# Patient Record
Sex: Female | Born: 1980 | Race: White | Hispanic: No | Marital: Married | Smoking: Never smoker
Health system: Southern US, Community
[De-identification: ages and names within clinical notes are randomized; demographics above are authoritative.]

---

## 2015-10-07 DIAGNOSIS — L700 Acne vulgaris: Secondary | ICD-10-CM | POA: Insufficient documentation

## 2018-06-10 ENCOUNTER — Other Ambulatory Visit: Payer: Self-pay | Admitting: Internal Medicine

## 2018-06-10 DIAGNOSIS — Z1231 Encounter for screening mammogram for malignant neoplasm of breast: Secondary | ICD-10-CM

## 2018-07-05 ENCOUNTER — Ambulatory Visit
Admission: RE | Admit: 2018-07-05 | Discharge: 2018-07-05 | Disposition: A | Discharge status: Home / Self Care | Payer: BLUE CROSS/BLUE SHIELD | Source: Ambulatory Visit | Attending: Internal Medicine | Admitting: Internal Medicine

## 2018-07-05 ENCOUNTER — Other Ambulatory Visit: Payer: Self-pay

## 2018-07-05 DIAGNOSIS — Z1231 Encounter for screening mammogram for malignant neoplasm of breast: Secondary | ICD-10-CM

## 2019-01-03 DIAGNOSIS — H6993 Unspecified Eustachian tube disorder, bilateral: Secondary | ICD-10-CM | POA: Insufficient documentation

## 2019-09-04 ENCOUNTER — Other Ambulatory Visit: Payer: Self-pay | Admitting: Internal Medicine

## 2019-09-04 DIAGNOSIS — Z1231 Encounter for screening mammogram for malignant neoplasm of breast: Secondary | ICD-10-CM

## 2019-09-12 ENCOUNTER — Ambulatory Visit: Payer: BLUE CROSS/BLUE SHIELD

## 2019-09-19 ENCOUNTER — Other Ambulatory Visit: Payer: Self-pay

## 2019-09-19 ENCOUNTER — Ambulatory Visit
Admission: RE | Admit: 2019-09-19 | Discharge: 2019-09-19 | Disposition: A | Discharge status: Home / Self Care | Payer: PRIVATE HEALTH INSURANCE | Source: Ambulatory Visit | Attending: Internal Medicine | Admitting: Internal Medicine

## 2019-09-19 DIAGNOSIS — Z1231 Encounter for screening mammogram for malignant neoplasm of breast: Secondary | ICD-10-CM

## 2019-09-24 ENCOUNTER — Other Ambulatory Visit: Payer: Self-pay | Admitting: Internal Medicine

## 2019-09-24 ENCOUNTER — Other Ambulatory Visit: Payer: Self-pay | Admitting: Family Medicine

## 2019-09-24 DIAGNOSIS — R928 Other abnormal and inconclusive findings on diagnostic imaging of breast: Secondary | ICD-10-CM

## 2019-09-26 ENCOUNTER — Ambulatory Visit
Admission: RE | Admit: 2019-09-26 | Discharge: 2019-09-26 | Disposition: A | Discharge status: Home / Self Care | Payer: PRIVATE HEALTH INSURANCE | Source: Ambulatory Visit | Attending: Internal Medicine | Admitting: Internal Medicine

## 2019-09-26 ENCOUNTER — Other Ambulatory Visit: Payer: Self-pay

## 2019-09-26 DIAGNOSIS — R928 Other abnormal and inconclusive findings on diagnostic imaging of breast: Secondary | ICD-10-CM

## 2021-04-04 ENCOUNTER — Other Ambulatory Visit: Payer: Self-pay | Admitting: Family Medicine

## 2021-04-04 DIAGNOSIS — Z1231 Encounter for screening mammogram for malignant neoplasm of breast: Secondary | ICD-10-CM

## 2021-06-06 IMAGING — MG DIGITAL SCREENING BILAT W/ TOMO W/ CAD
8 series · 8 of 24 positions shown · non-contrast
Comparison: Prior films

CLINICAL DATA: Screening.

EXAM:
DIGITAL SCREENING BILATERAL MAMMOGRAM WITH TOMO AND CAD

[L MLO synth-2D]
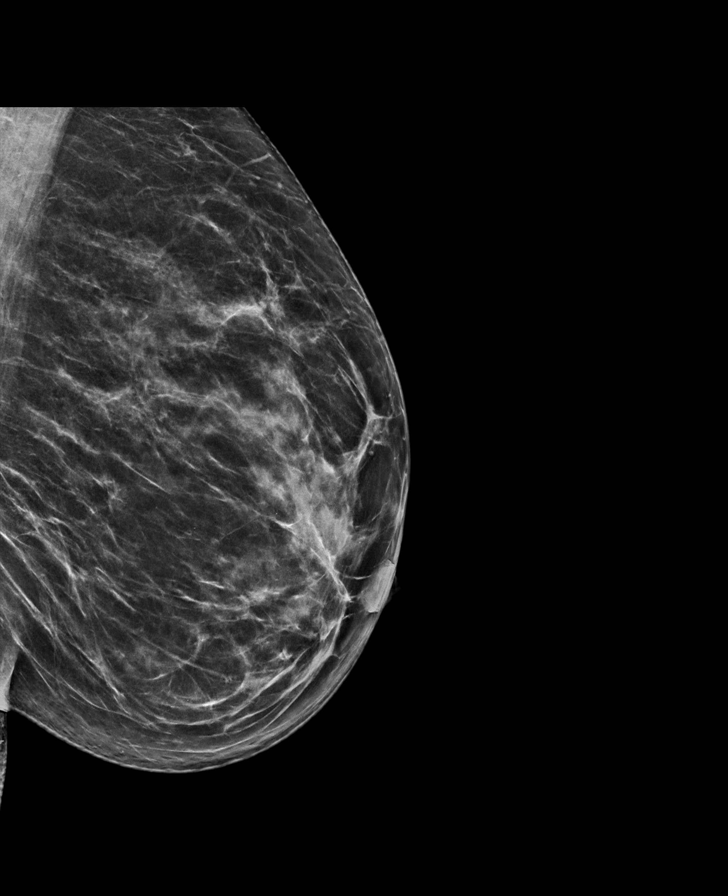

[L CC synth-2D]
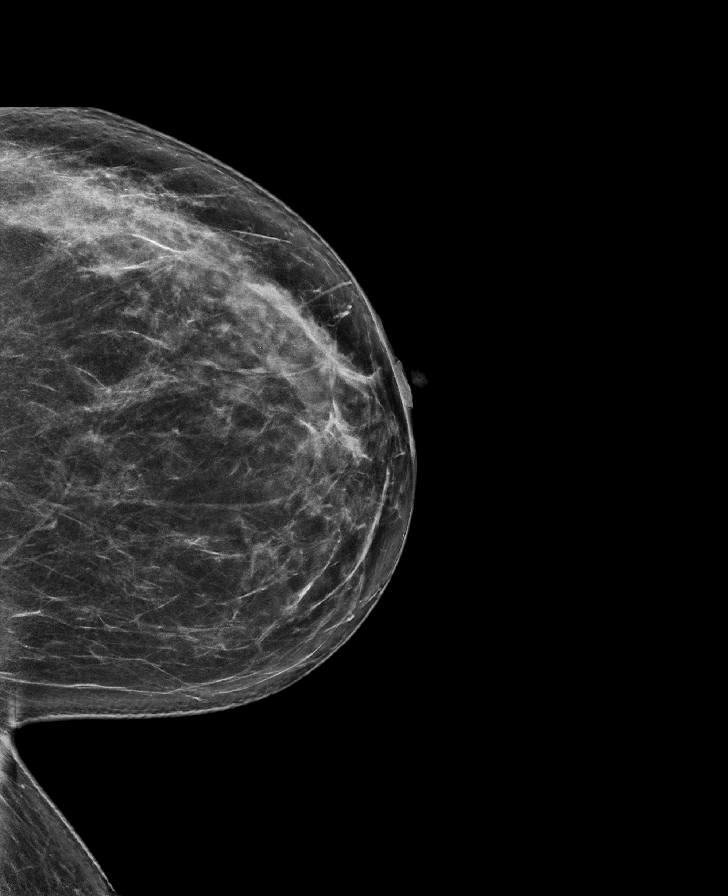

[R CC synth-2D]
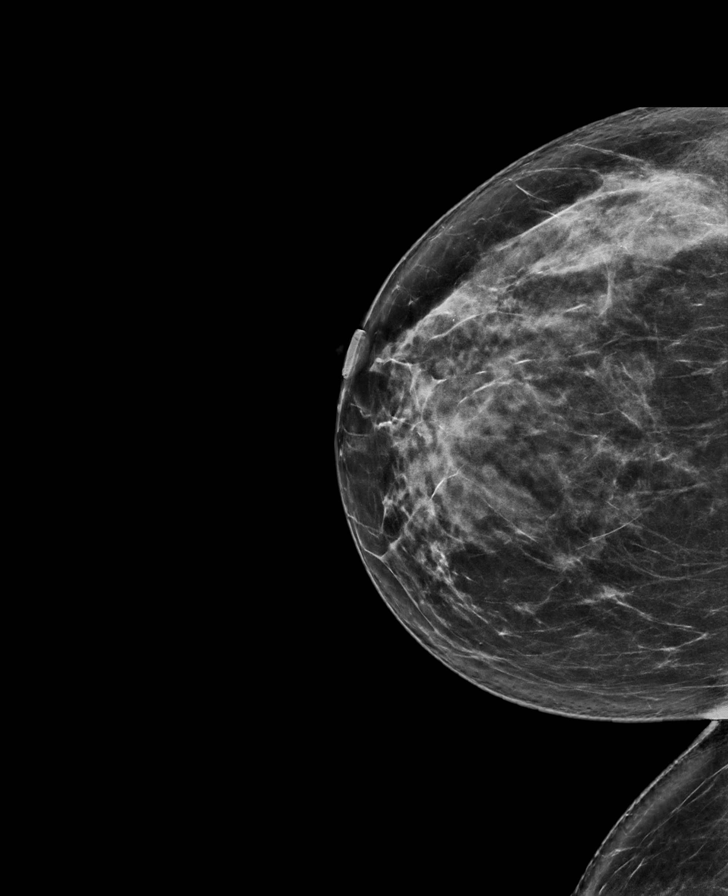

[R MLO synth-2D]
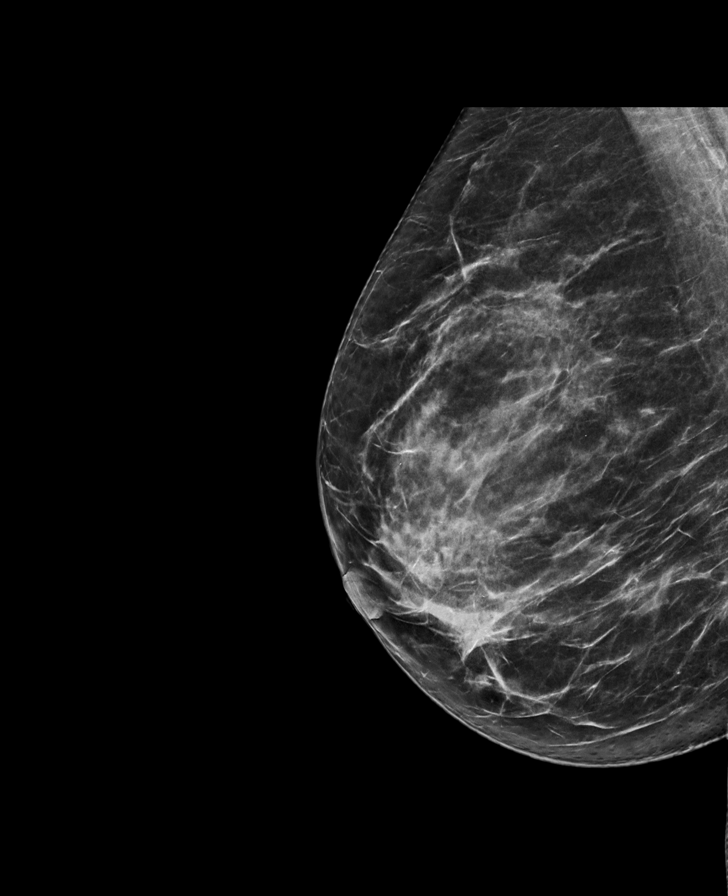

[R MLO tomo · tomo slice 37/74.0]
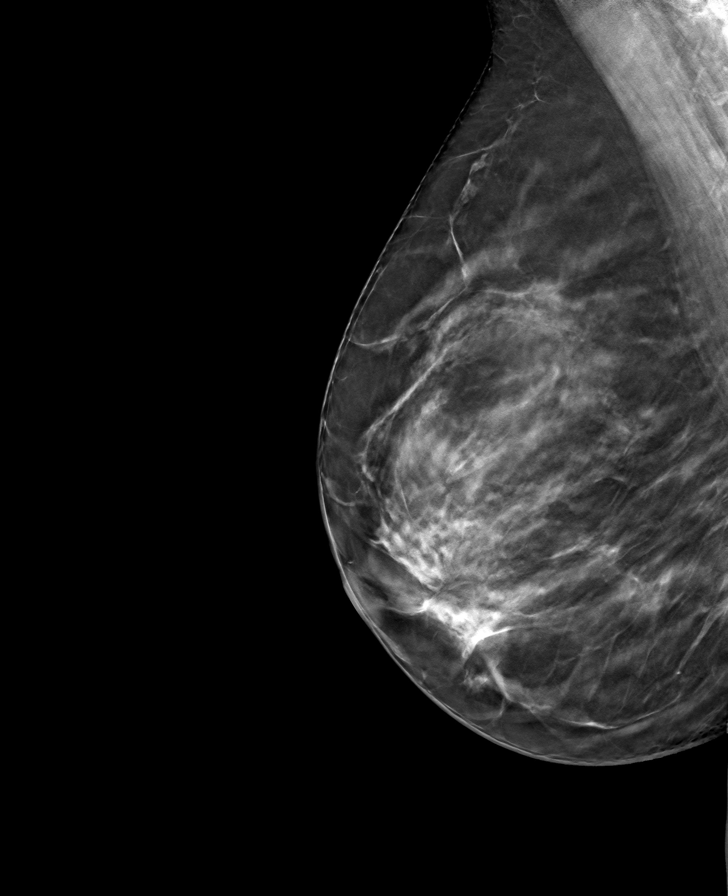

[R CC tomo · tomo slice 39/77.0]
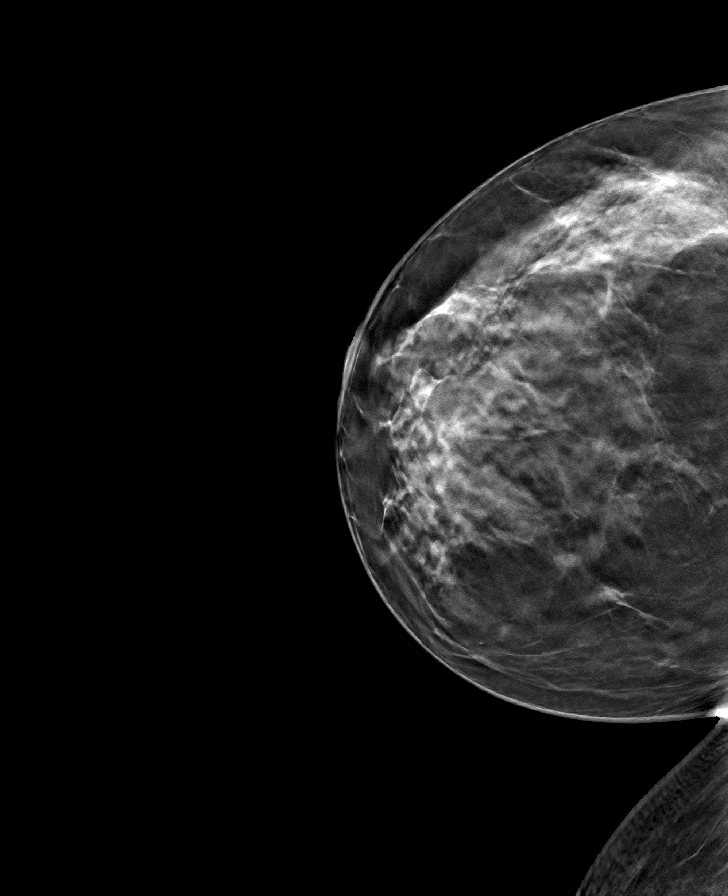

[L MLO tomo · tomo slice 37/74.0]
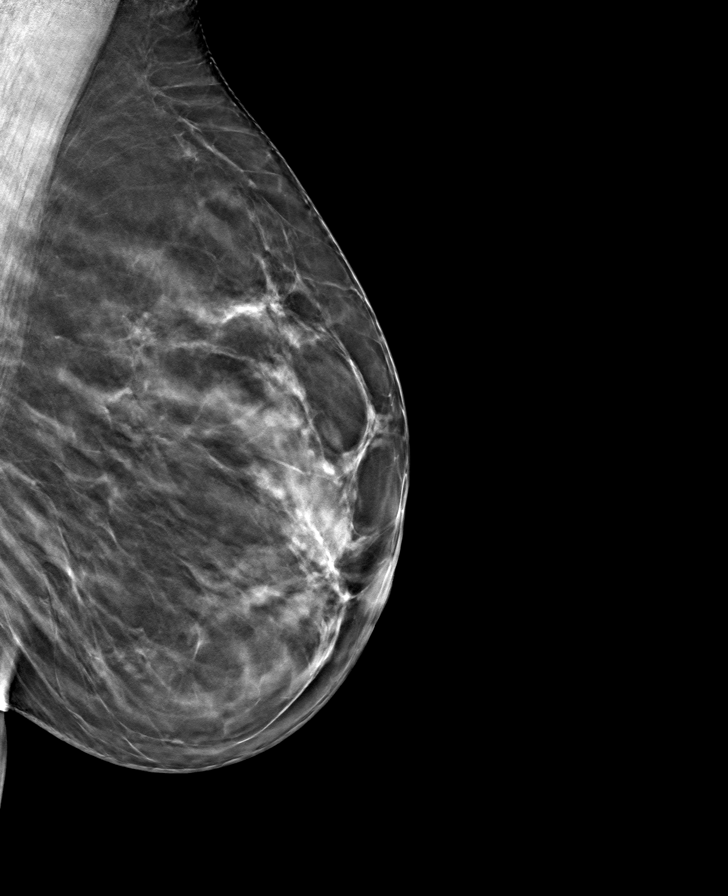

[L CC tomo · tomo slice 40/79.0]
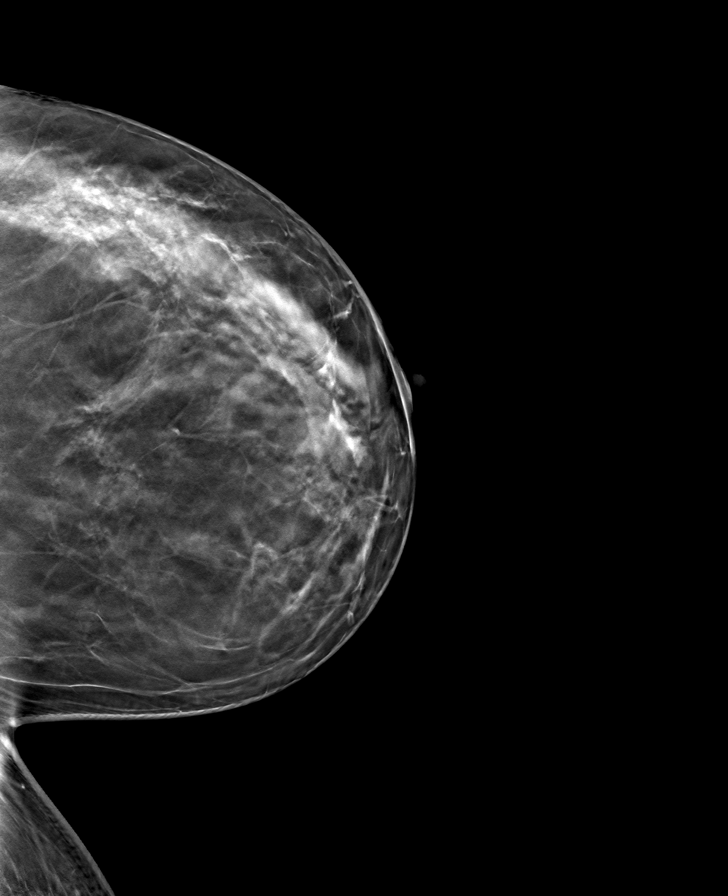

[8 of 24 positions shown; findings below may reference images not displayed]

ACR Breast Density Category c: The breast tissue is heterogeneously
dense, which may obscure small masses.
FINDINGS: In the right breast, a possible asymmetry warrants further
evaluation. In the left breast, no findings suspicious for
malignancy. Images were processed with CAD.
IMPRESSION: Further evaluation is suggested for possible asymmetry in the right
breast.

RECOMMENDATION:
Diagnostic mammogram and possibly ultrasound of the right breast.
(Code:[id number])

The patient will be contacted regarding the findings, and additional
imaging will be scheduled.

BI-RADS CATEGORY  0: Incomplete. Need additional imaging evaluation
and/or prior mammograms for comparison.

## 2022-05-11 ENCOUNTER — Other Ambulatory Visit: Payer: Self-pay | Admitting: Otolaryngology

## 2022-05-11 DIAGNOSIS — H9209 Otalgia, unspecified ear: Secondary | ICD-10-CM

## 2022-07-06 ENCOUNTER — Encounter: Payer: Self-pay | Admitting: Neurology

## 2022-10-04 ENCOUNTER — Encounter: Payer: Self-pay | Admitting: Neurology

## 2022-10-04 ENCOUNTER — Ambulatory Visit: Payer: PRIVATE HEALTH INSURANCE | Admitting: Neurology

## 2022-10-04 VITALS — BP 100/60 | HR 88 | Ht 64.0 in | Wt 178.0 lb

## 2022-10-04 DIAGNOSIS — G501 Atypical facial pain: Secondary | ICD-10-CM | POA: Diagnosis not present

## 2022-10-04 MED ORDER — CARBAMAZEPINE 200 MG PO TABS: ORAL_TABLET | ORAL | 3 refills | Status: DC

## 2022-10-04 NOTE — Progress Notes (Signed)
[facility] Clinic Note - Initial Visit   Date: 10/04/2022   Brandi Freeman MRN: [id number] DOB: 22-Mar-1981   Dear Dr. Lenise Arena:  Thank you for your kind referral of Brandi Freeman for consultation of left facial pain. Although her history is well known to you, please allow [place] to reiterate it for the purpose of our medical record. The patient was accompanied to the clinic by self.     Brandi Freeman is a 42 y.o. right-handed female with no prior medical history presenting for evaluation of left ear pain.   IMPRESSION/PLAN: Atypical left facial pain, described as constant auricular dullness with episodic sharp radiating pain into the jaw.  ENT and dental evaluation has been unrevealing.  Although she has some shooting pain, she does not have the typical electrical/lancinating quality of pain that I would expect for trigeminal neuralgia.  Nevertheless, I would like to evaluate for this with MRI face wwo contrast (trigeminal nerve protocol) and offer a trial of carbamazepine 200mg  BID.  Side effects discussed and she was informed to stop medication immediately, if she develops a rash.  She does not have plans for pregnancy.   Further recommendations pending results.  ------------------------------------------------------------- History of present illness: Starting around 2022, she began having left ear pain, described as dull pain.  Dull ear/jaw pain is every day and constant.  She has spells of severe, sharp pain which can radiate towards her jar.  This sharp pain can last for 5-6 hours and self-resolves.  She has seen ENT and dentist which has been unrevealing.  She was found to grind her teeth and was given a bite guard, however, she was unable to tolerate it.  No evidence of TMJ.  She has some relief with methocarbamol.  She has been given prednisone taper which eases the pain, but once she has completed the course, pain returns.  She has not found any lasting relief and  is frustrated by the pain.  No similar symptoms on the right side.  No numbness or tingling.  Symptoms are not triggered by chewing, talking, tactile stimulus of the face.     History reviewed. No pertinent past medical history.  History reviewed. No pertinent surgical history.   Medications:  Outpatient Encounter Medications as of 10/04/2022  Medication Sig   Ferrous Gluconate (IRON 27 PO) Take by mouth.   No facility-administered encounter medications on file as of 10/04/2022.    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  Speech is not dysarthric.  CRANIAL NERVES: II:  No visual field defects.     III-IV-VI: Pupils equal round and reactive to light.  Normal conjugate, extra-ocular eye movements  in all directions of gaze.  No nystagmus.  No ptosis.   V:  Normal facial sensation.    VII:  Normal facial symmetry and movements.   VIII:  Normal hearing and vestibular function.   IX-X:  Normal palatal movement.   XI:  Normal shoulder shrug and head rotation.   XII:  Normal tongue strength and range of motion, no deviation or fasciculation.  MOTOR:  Motor strength is 5/5 throughout.  No atrophy, fasciculations or abnormal movements.  No pronator drift.   MSRs:                                            Right        Left brachioradialis 2+  2+  biceps 2+  2+  triceps 2+  2+  patellar 2+  2+  ankle jerk 2+  2+  Hoffman no  no  plantar response down  down   SENSORY:  Normal and symmetric perception of light touch, pinprick, vibration, and proprioception.  Romberg's sign absent.   COORDINATION/GAIT: Normal finger-to- nose-finger.  Intact rapid alternating movements bilaterally.  Able to rise from a chair without using arms.  Gait narrow based and stable. Tandem and stressed gait intact.      Thank you for allowing me to participate in patient's care.  If I can answer any additional questions, I would be pleased to do so.    Sincerely,    Rosendo Couser K. Allena Katz, DO

## 2022-10-04 NOTE — Patient Instructions (Addendum)
Start carbamazepine 200mg  at bedtime x 1 week, then increase to 1 tablet twice daily.  Stop medication immediately if you develop a rash.   We will also get MRI trigeminal nerve  Further recommendations pending results.

## 2022-10-10 ENCOUNTER — Telehealth: Payer: Self-pay | Admitting: Neurology

## 2022-10-10 NOTE — Telephone Encounter (Signed)
Pending signature. Then will fax as requested.

## 2022-10-10 NOTE — Telephone Encounter (Signed)
Order faxed.

## 2022-10-10 NOTE — Telephone Encounter (Signed)
Left message with the after hour service on 10-10-22 at 12:51 pm   Caller states she wants to know if the MRI order was sent to her insurance  the fax number is [phone]

## 2022-10-12 ENCOUNTER — Telehealth: Payer: Self-pay | Admitting: Neurology

## 2022-10-12 NOTE — Telephone Encounter (Signed)
Caller states she has called several times this week to ask for an MRI order to her insurance. Caller wants a call back. Fax number for her insurance is [phone]

## 2022-10-12 NOTE — Telephone Encounter (Signed)
Called patient and left a detailed message per DPR that I have faxed her order over as requested previously. I will refax again in case they did not receive it. Left contact information incase patient wants to call us back.

## 2022-11-17 ENCOUNTER — Telehealth: Payer: Self-pay | Admitting: Neurology

## 2022-11-17 NOTE — Telephone Encounter (Signed)
Called patient and left a message for a call back.  

## 2022-11-17 NOTE — Telephone Encounter (Signed)
Please let pt know that MRI brain is normal and does not show any abnormalities.  She was started on carbamazepine at her last visit, is that helping with her discomfort?  Please also schedule follow-up in 2 months (ok to use work-in).   MRI brain 11/17/2022:  Normal.   No abnormalities, including masses or enhancing lesions, to explain patient's reported symptoms.

## 2022-11-20 MED ORDER — GABAPENTIN 300 MG PO CAPS: 300.0000 mg | ORAL_CAPSULE | Freq: Every day | ORAL | 3 refills | Status: DC

## 2022-11-20 NOTE — Telephone Encounter (Signed)
Called patient and informed her of results of her MRI brain. Also asked how the carbamazepine has been helping? Patient stated that she took it for 3 days and had to stop it because it gave her a bad headache. She states she still is having pain. Patient wanted to let Dr. Allena Katz know that she has an appointment with ENT in 2 months. Should she scheduled follow up with Dr. Allena Katz after this appointment?   Patient stated that is she does not answer to leave her a detailed message.

## 2022-11-20 NOTE — Telephone Encounter (Signed)
Called patient and informed her that if she would like to try gabapentin 300mg  at bedtime for her pain, we can send Rx. OK to follow-up with Dr. Allena Katz  after seeing ENT. Patient would like to try the Gabapentin to see if it will help with her facial pain. Patient aware to give it sometime to see if it works. Patient will call office to scheduled a follow up with Dr. Allena Katz after her ENT appt.

## 2022-11-20 NOTE — Telephone Encounter (Signed)
Rx has been sent. Thank you!

## 2022-11-20 NOTE — Telephone Encounter (Signed)
If she would like to try gabapentin 300mg  at bedtime for her pain, we can send Rx.  OK to follow-up with me after seeing ENT.

## 2022-12-19 ENCOUNTER — Telehealth: Payer: Self-pay | Admitting: Neurology

## 2022-12-19 NOTE — Telephone Encounter (Signed)
Called patient and left a message for a call back.  

## 2022-12-19 NOTE — Telephone Encounter (Signed)
Caller stated she has finished Gabapentin medication and she would like to speak with nurse about side affects that she's been having.

## 2022-12-21 NOTE — Telephone Encounter (Signed)
Called patient and left a message for a call back.  

## 2022-12-21 NOTE — Telephone Encounter (Signed)
Patient was calling nurse back, Patient states that gabapentin is working really well except for the side effects, her period is late

## 2022-12-22 NOTE — Telephone Encounter (Signed)
Called patient and informed her of Dr. Eliane Decree advice below. Patient wanted to know if we have any recommendations of an OB/GYN. I informed patient she can try to contact [facility] on [address]. Patient will give them a call. Patient had no further questions or concerns.

## 2022-12-22 NOTE — Telephone Encounter (Signed)
It looks like gabapentin causing change to menses can be a rare side effect.  I have not come across it before, but the only way to tell is by stopping the gabapentin, which she has already done.  If she has not had a period by now, it is less likely a medication effect.  I suggest that she stay off medication and follow-up with OBGYN.

## 2022-12-22 NOTE — Telephone Encounter (Signed)
Called patient and she informed me that her period is late. She states she was supposed to get it on 11/24/22. She thinks she was late due to her Gabapentin. Therefore, she stopped her Gabapentin on 12/01/22 and still has not gotten her period. She took a pregnancy test and that was negative. Patient also contacted her PCP and they told her to contact the doctor who is prescribing her Gabapentin.    Informed patient that I will let Dr. Allena Katz know and give her a call back.

## 2023-04-20 LAB — HM PAP SMEAR

## 2023-08-31 ENCOUNTER — Ambulatory Visit: Payer: PRIVATE HEALTH INSURANCE | Admitting: Obstetrics

## 2023-08-31 ENCOUNTER — Encounter: Payer: Self-pay | Admitting: Obstetrics

## 2023-08-31 VITALS — BP 107/73 | HR 84 | Ht 62.99 in | Wt 182.2 lb

## 2023-08-31 DIAGNOSIS — K5900 Constipation, unspecified: Secondary | ICD-10-CM | POA: Diagnosis not present

## 2023-08-31 DIAGNOSIS — N390 Urinary tract infection, site not specified: Secondary | ICD-10-CM

## 2023-08-31 DIAGNOSIS — R310 Gross hematuria: Secondary | ICD-10-CM | POA: Diagnosis not present

## 2023-08-31 DIAGNOSIS — R399 Unspecified symptoms and signs involving the genitourinary system: Secondary | ICD-10-CM | POA: Insufficient documentation

## 2023-08-31 LAB — POCT URINALYSIS DIPSTICK
Bilirubin, UA: NEGATIVE
Bilirubin, UA: NEGATIVE
Blood, UA: NEGATIVE
Glucose, UA: NEGATIVE
Glucose, UA: NEGATIVE
Ketones, UA: NEGATIVE
Ketones, UA: NEGATIVE
Leukocytes, UA: NEGATIVE
Leukocytes, UA: NEGATIVE
Nitrite, UA: NEGATIVE
Nitrite, UA: NEGATIVE
Protein, UA: NEGATIVE
Protein, UA: NEGATIVE
Spec Grav, UA: 1.02 (ref 1.010–1.025)
Spec Grav, UA: 1.025 (ref 1.010–1.025)
Urobilinogen, UA: 0.2 U/dL
Urobilinogen, UA: 0.2 U/dL
pH, UA: 5.5 (ref 5.0–8.0)
pH, UA: 5.5 (ref 5.0–8.0)

## 2023-08-31 NOTE — Assessment & Plan Note (Addendum)
-   reports gross hematuria with UTI symptoms 03/2023 - POCT clean catch UA + heme, catheterized sample negative - For management of gross hematuria, we discussed the importance of work-up including assessing the upper and lower GU tract with CT urogram and cystoscopy.  -  Cr 0.76 on 04/20/23 - She will pursue this work-up and follow-up afterward to discuss the results and decide on a treatment plan based on the findings.  - office to schedule cystoscopy and CT urogram pending - encouraged barrier method use to avoid pregnancy

## 2023-08-31 NOTE — Patient Instructions (Addendum)
 For irritative bladder we reviewed treatment options including altering her diet to avoid irritative beverages and foods as well as attempting to decrease stress and other exacerbating factors.  We also discussed using pyridium and similar over-the-counter medications for pain relief as needed. We discussed the pentad of medications including Tums, an antihistamine such as Vistaril, amitriptyline, and L-arginine.  We also discussed in-office bladder instillations for pain flares, as well as cystoscopy with hydrodistention in the operating room, which can be both diagnostic and therapeutic. She was also given information on the IC Network at https://www.ic-network.com for bladder diet suggestions and patient forums for support.   Today we talked about ways to manage bladder urgency such as altering your diet to avoid irritative beverages and foods (bladder diet) as well as attempting to decrease stress and other exacerbating factors.  You can also chew a plain Tums 1-3 times per day to make your urine less acidic, especially if you have eating/drinking acidic things.   There is a website with helpful information for people with bladder irritation, called the IC Network at https://www.ic-network.com. This website has more information about a healthy bladder diet and patient forums for support.  The Most Bothersome Foods* The Least Bothersome Foods*  Coffee - Regular & Decaf Tea - caffeinated Carbonated beverages - cola, non-colas, diet & caffeine-free Alcohols - Beer, Red Wine, White Wine, [address] - Grapefruit, [place], Orange, [employer] - Cranberry, Grapefruit, Orange, Pineapple Vegetables - Tomato & Tomato Products Flavor Enhancers - Hot peppers, Spicy foods, Chili, Horseradish, Vinegar, Monosodium glutamate (MSG) Artificial Sweeteners - NutraSweet, Sweet 'N Low, Equal (sweetener), Saccharin Ethnic foods - [place], [place], [place] food [employer] - low-fat & whole Fruits - Bananas,  Blueberries, Honeydew melon, Pears, Raisins, Watermelon Vegetables - Broccoli, [address] Sprouts, [place], Carrots, Cauliflower, [place], Cucumber, Mushrooms, Peas, Radishes, Squash, Zucchini, White potatoes, Sweet potatoes & yams Poultry - Chicken, Eggs, [place], [employer] - Beef, Diplomatic Services operational officer, Lamb Seafood - Shrimp, [place] fish, Salmon Grains - Oat, Rice Snacks - Pretzels, Popcorn  *Theodosia Fishman et al. Diet and its role in interstitial cystitis/bladder pain syndrome (IC/BPS) and comorbid conditions. [employer]. BJU Int. 2012 Jan 11.    Continue Ibuprofen up to 600mg  every 6 hours and pyridium as needed when you experience urinary symptoms.   Constipation: Our goal is to achieve formed bowel movements daily or every-other-day.  You may need to try different combinations of the following options to find what works best for you - everybody's body works differently so feel free to adjust the dosages as needed.  Some options to help maintain bowel health include:  Dietary changes (more leafy greens, vegetables and fruits; less processed foods) Fiber supplementation (Benefiber, FiberCon, Metamucil or Psyllium). Start slow and increase gradually to full dose. Over-the-counter agents such as: stool softeners (Docusate or Colace) and/or laxatives (Miralax, milk of magnesia)  "Power Pudding" is a natural mixture that may help your constipation.  To make blend 1 cup applesauce, 1 cup wheat bran, and 3/4 cup prune juice, refrigerate and then take 1 tablespoon daily with a large glass of water as needed.   Women should try to eat at least 21 to 25 grams of fiber a day, while men should aim for 30 to 38 grams a day. You can add fiber to your diet with food or a fiber supplement such as psyllium (metamucil), benefiber, or fibercon.   Here's a look at how much dietary fiber is found in some common foods. When buying packaged foods, check  the Nutrition Facts label for fiber content. It can vary among brands.  Fruits  Serving size Total fiber (grams)*  Raspberries 1 cup 8.0  Pear 1 medium 5.5  Apple, with skin 1 medium 4.5  Banana 1 medium 3.0  Orange 1 medium 3.0  Strawberries 1 cup 3.0   Vegetables Serving size Total fiber (grams)*  Green peas, boiled 1 cup 9.0  Broccoli, boiled 1 cup chopped 5.0  Turnip greens, boiled 1 cup 5.0  Brussels sprouts, boiled 1 cup 4.0  Potato, with skin, baked 1 medium 4.0  Sweet corn, boiled 1 cup 3.5  Cauliflower, raw 1 cup chopped 2.0  Carrot, raw 1 medium 1.5   Grains Serving size Total fiber (grams)*  Spaghetti, whole-wheat, cooked 1 cup 6.0  Barley, pearled, cooked 1 cup 6.0  Bran flakes 3/4 cup 5.5  Quinoa, cooked 1 cup 5.0  Oat bran muffin 1 medium 5.0  Oatmeal, instant, cooked 1 cup 5.0  Popcorn, air-popped 3 cups 3.5  Brown rice, cooked 1 cup 3.5  Bread, whole-wheat 1 slice 2.0  Bread, rye 1 slice 2.0   Legumes, nuts and seeds Serving size Total fiber (grams)*  Split peas, boiled 1 cup 16.0  Lentils, boiled 1 cup 15.5  Black beans, boiled 1 cup 15.0  Baked beans, canned 1 cup 10.0  Chia seeds 1 ounce 10.0  Almonds 1 ounce (23 nuts) 3.5  Pistachios 1 ounce (49 nuts) 3.0  Sunflower kernels 1 ounce 3.0  *Rounded to nearest 0.5 gram. Source: [employer] for Standard Reference, Legacy Release    Please call radiology at [phone] to schedule your imaging study today

## 2023-08-31 NOTE — Progress Notes (Signed)
 New Patient Evaluation and Consultation  Referring Provider: Jinger Mount, DO PCP: Luevenia Saha, MD Date of Service: 08/31/2023  SUBJECTIVE Chief Complaint: New Patient (Initial Visit) (Brandi Freeman is a 43 y.o. female here for chronic UTI. )  History of Present Illness: Brandi Freeman is a 43 y.o. [place] female seen in consultation at the request of Dr Alecia Huntsman for evaluation of frequent UTIs.    Urinary symptoms started around 6 months ago UTI symptoms: blood in urine, pain with urination Reports UTI symptoms with incomplete emptying sensation postcoitally with negative urine cultures, sometimes triggered by Azithromycin. Managed by standing and straining. Reports urinary urgency/frequency after small amount of tea, and sensation of incomplete emptying for 2-3 years with weight changes. Denies hot flashes, vaginal dryness or dyspareunia. Desires to transition healthy towards menopause since her grandmother went into menopause at 40yo.  Denies vaginal discharge, reports using acidic product externally for vaginal odor intermittently. Changes underwear 1x/day due to odor since getting married.  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 Abel Abelson at [facility], symptoms started when she moved here from [place] in 2013, Cr 0.76 on 04/20/23  Urinary Symptoms: Does not leak urine.   Day time voids 8-10 since 5-6 years ago.  Nocturia: 1 times per night to void. Voiding dysfunction:  does not empty bladder well.  Patient does not use a catheter to empty bladder.  When urinating, patient feels to push on her belly or vagina to empty bladder Drinks: 32oz water per day, 10oz coffee, 10oz tea  UTIs: 3-4 UTI's in the  last year.   Reports history of blood in urine Denies kidney infection, urinary tract cancers No results found for the last 90 days.   Pelvic Organ Prolapse Symptoms:                  Patient Denies a feeling of a bulge the vaginal area.   Bowel Symptom: Bowel movements: 5 time(s) per week Stool consistency: hard or soft  Straining: yes.  Splinting: no Incomplete evacuation: yes.  Patient Denies accidental bowel leakage / fecal incontinence however reports intermittent anal itching attributed to moisture  Bowel regimen: diet Last colonoscopy: due for age based screening HM Colonoscopy          Current Care Gaps     Colonoscopy (Every 10 Years) Never done   No completion history exists for this topic.                 Sexual Function Sexually active: yes.  Sexual orientation: Straight Pain with sex: No  Pelvic Pain Denies pelvic pain  Past Medical History: History reviewed. No pertinent past medical history.   Past Surgical History:   Past Surgical History:  Procedure Laterality Date   CESAREAN SECTION       Past OB/GYN History: OB History  Gravida Para Term Preterm AB Living  1 1 1   1   SAB IAB Ectopic Multiple Live Births      1    # Outcome Date GA Lbr Len/2nd Weight Sex Type Anes PTL Lv  1 Term     M CS-LTranv   LIV    Vaginal deliveries: 0,  Forceps/ Vacuum deliveries: 0, Cesarean section: 1 Menopausal: No, LMP 08/09/23 Contraception: none.  Last pap smear was 04/20/23.  Any history of abnormal pap smears: no. No results found for: "DIAGPAP", "HPVHIGH", "ADEQPAP"  Medications: Patient has a current medication list which includes the following prescription(s): ferrous gluconate.   Allergies: Patient has no known allergies.   Social History:  Social History   Tobacco Use   Smoking status: Never   Smokeless tobacco: Never  Vaping Use   Vaping status: Never Used  Substance Use Topics   Alcohol use: Not Currently   Drug use: Never     Relationship status: married Patient lives with her husband and son.   Patient is employed as an Publishing rights manager. Regular exercise: No, walking only History of abuse: No  Family History:   Family History  Problem Relation Age of Onset   Diabetes Maternal Grandfather    Uterine cancer Neg Hx    Renal cancer Neg Hx    Bladder Cancer Neg Hx      Review of Systems: Review of Systems  Constitutional:  Negative for fever, malaise/fatigue and weight loss.  Respiratory:  Negative for cough, shortness of breath and wheezing.   Cardiovascular:  Negative for chest pain, palpitations and leg swelling.  Gastrointestinal:  Positive for constipation. Negative for abdominal pain and blood in stool.  Genitourinary:  Positive for frequency (postcoital). Negative for dysuria, hematuria and urgency.  Skin:  Negative for rash.  Neurological:  Negative for dizziness, weakness and headaches.  Endo/Heme/Allergies:  Does not bruise/bleed easily.  Psychiatric/Behavioral:  Negative for depression. The patient is not nervous/anxious.      OBJECTIVE Physical Exam: Vitals:   08/31/23 0809  BP: 107/73  Pulse: 84  Weight: 182 lb 3.2 oz (82.6 kg)  Height: 5' 2.99" (1.6 m)    Physical Exam Constitutional:      General: She is not in acute distress.    Appearance: Normal appearance.  Genitourinary:     Bladder and urethral meatus normal.     No lesions in the vagina.     Right Labia: No rash, tenderness, lesions, skin changes or Bartholin's cyst.    Left Labia: No tenderness, lesions, skin changes, Bartholin's cyst or rash.    Vaginal bleeding (noted from cervix due to menses) present.     No vaginal discharge, erythema, tenderness, ulceration or granulation tissue.     No vaginal prolapse present.    No vaginal atrophy present.     Right Adnexa: not tender, not full and no mass present.    Left Adnexa: not tender, not full and no mass present.    No cervical motion tenderness, discharge,  friability, lesion, polyp or nabothian cyst.     Uterus is not enlarged, fixed, tender, irregular or prolapsed.     No uterine mass detected.    Urethral meatus caruncle not present.    No urethral prolapse, tenderness, mass, hypermobility, discharge or stress urinary incontinence with cough stress test present.     Bladder is not tender, urgency on palpation not present and masses not present.      Pelvic Floor: Levator muscle strength is 4/5.    Levator ani not tender, obturator internus not tender, no asymmetrical contractions present and no pelvic spasms present.    Pelvic Floor comments: Pressure and discomfort with palpation of pelvic floor muscles L > R.    Symmetrical pelvic sensation, anal wink present and BC reflex present. Cardiovascular:     Rate and Rhythm: Normal rate.  Pulmonary:     Effort: Pulmonary effort is normal.  No respiratory distress.  Abdominal:     General: Abdomen is flat. There is no distension.     Palpations: Abdomen is soft. There is no mass.     Tenderness: There is no abdominal tenderness.     Hernia: No hernia is present.    Neurological:     Mental Status: She is alert.  Vitals reviewed. Exam conducted with a chaperone present.      POP-Q:   POP-Q  -3                                            Aa   -3                                           Ba  -6                                              C   2                                            Gh  3                                            Pb  8                                            tvl   -2                                            Ap  -2                                            Bp  -7                                              D    Post-Void Residual (PVR) by Bladder Scan: In order to evaluate bladder emptying, we discussed obtaining a postvoid residual and patient agreed to this procedure.  Procedure: The ultrasound unit was placed on the patient's abdomen in the  suprapubic region after the patient had voided.    Post Void Residual - 08/31/23 0826       Post Void Residual   Post Void Residual 10 mL            Straight Catheterization Procedure for PVR: After verbal consent was obtained from the patient for catheterization to assess  bladder emptying and residual volume the urethra and surrounding tissues were prepped with betadine and an in and out catheterization was performed.  PVR was 70mL.  Urine appeared clear yellow. The patient tolerated the procedure well.  Laboratory Results from clean catch urine: Lab Results  Component Value Date   COLORU Yellow 08/31/2023   CLARITYU Clear 08/31/2023   GLUCOSEUR Negative 08/31/2023   BILIRUBINUR Negative 08/31/2023   KETONESU Negative 08/31/2023   SPECGRAV 1.020 08/31/2023   RBCUR Moderate 08/31/2023   PHUR 5.5 08/31/2023   PROTEINUR Negative 08/31/2023   UROBILINOGEN 0.2 08/31/2023   LEUKOCYTESUR Negative 08/31/2023   Repeat from catheterized urine: Lab Results  Component Value Date   COLORU yellow 08/31/2023   CLARITYU clear 08/31/2023   GLUCOSEUR Negative 08/31/2023   BILIRUBINUR negative 08/31/2023   KETONESU negative 08/31/2023   SPECGRAV 1.025 08/31/2023   RBCUR negative 08/31/2023   PHUR 5.5 08/31/2023   PROTEINUR Negative 08/31/2023   UROBILINOGEN 0.2 08/31/2023   LEUKOCYTESUR Negative 08/31/2023   No results found for: "CREATININE"  No results found for: "HGBA1C"  No results found for: "HGB"   ASSESSMENT AND PLAN Brandi Freeman is a 43 y.o. with:  1. Lower urinary tract symptoms   2. Gross hematuria   3. Constipation, unspecified constipation type     Lower urinary tract symptoms Assessment & Plan: - postcoital UTI symptoms with negative urine cultures  - history of pruritus ani - For irritative bladder we reviewed treatment options including altering her diet to avoid irritative beverages and foods as well as attempting to decrease stress and other exacerbating  factors.  We also discussed using pyridium and similar over-the-counter medications for pain relief as needed. We discussed the pentad of medications including Tums, an antihistamine such as Vistaril, amitriptyline, and L-arginine.  We also discussed in-office bladder instillations for pain flares, as well as cystoscopy with hydrodistention in the operating room, which can be both diagnostic and therapeutic. She was also given information on the IC Network at https://www.ic-network.com for bladder diet suggestions and patient forums for support. - start NSAIDs and pyridium PRN bladder symptoms - encouraged to avoid antibiotic use in the absence of infection due to risk of resistance  Orders: -     POCT urinalysis dipstick -     POCT urinalysis dipstick -     CT HEMATURIA WORKUP; Future  Gross hematuria Assessment & Plan: - reports gross hematuria with UTI symptoms 03/2023 - POCT clean catch UA + heme, catheterized sample negative - For management of gross hematuria, we discussed the importance of work-up including assessing the upper and lower GU tract with CT urogram and cystoscopy.  -  Cr 0.76 on 04/20/23 - She will pursue this work-up and follow-up afterward to discuss the results and decide on a treatment plan based on the findings.  - office to schedule cystoscopy and CT urogram pending - encouraged barrier method use to avoid pregnancy   Orders: -     CT HEMATURIA WORKUP; Future  Constipation, unspecified constipation type Assessment & Plan: - Bristol IV stool 5x/week with straining for bowel movements - self started iron supplementation since pregnancy due to weakness noted after menses  - last hgb 12.7 on 11/24/21 - For constipation, we reviewed the importance of a better bowel regimen.  We also discussed the importance of avoiding chronic straining, as it can exacerbate her pelvic floor symptoms; we discussed treating constipation and straining prior to surgery, as postoperative  straining can lead to damage to  the repair and recurrence of symptoms. We discussed initiating therapy with increasing fluid intake, fiber supplementation, stool softeners, and laxatives such as miralax.  - encouraged to continue high fiber diet and monitor total fiber intake for 35-38g of fiber/day - encouraged squatting position for bowel movements and pelvic floor relaxation   Time spent: I spent 65 minutes dedicated to the care of this patient on the date of this encounter to include pre-visit review of records, face-to-face time with the patient discussing lower urinary tract symptoms, gross hematuria, constipation and post visit documentation and ordering medication/ testing.    Darlene Ehlers, MD

## 2023-08-31 NOTE — Assessment & Plan Note (Addendum)
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  We also discussed using pyridium and similar over-the-counter medications for pain relief as needed. We discussed the pentad of medications including Tums, an antihistamine such as Vistaril, amitriptyline, and L-arginine.  We also discussed in-office bladder instillations for pain flares, as well as cystoscopy with hydrodistention in the operating room, which can be both diagnostic and therapeutic. She was also given information on the IC Network at https://www.ic-network.com for bladder diet suggestions and patient forums for support. - start NSAIDs and pyridium PRN bladder symptoms - encouraged to avoid antibiotic use in the absence of infection due to risk of resistance

## 2023-08-31 NOTE — Assessment & Plan Note (Signed)
-   Bristol IV stool 5x/week with straining for bowel movements - self started iron supplementation since pregnancy due to weakness noted after menses  - last hgb 12.7 on 11/24/21 - For constipation, we reviewed the importance of a better bowel regimen.  We also discussed the importance of avoiding chronic straining, as it can exacerbate her pelvic floor symptoms; we discussed treating constipation and straining prior to surgery, as postoperative straining can lead to damage to the repair and recurrence of symptoms. We discussed initiating therapy with increasing fluid intake, fiber supplementation, stool softeners, and laxatives such as miralax.  - encouraged to continue high fiber diet and monitor total fiber intake for 35-38g of fiber/day - encouraged squatting position for bowel movements and pelvic floor relaxation

## 2023-09-07 ENCOUNTER — Ambulatory Visit (HOSPITAL_COMMUNITY)

## 2023-09-12 ENCOUNTER — Ambulatory Visit (INDEPENDENT_AMBULATORY_CARE_PROVIDER_SITE_OTHER): Payer: Self-pay | Admitting: Family Medicine

## 2023-09-12 ENCOUNTER — Encounter: Payer: Self-pay | Admitting: Family Medicine

## 2023-09-12 VITALS — BP 118/64 | Temp 97.7°F | Ht 62.0 in | Wt 183.8 lb

## 2023-09-12 DIAGNOSIS — Z1159 Encounter for screening for other viral diseases: Secondary | ICD-10-CM

## 2023-09-12 DIAGNOSIS — N951 Menopausal and female climacteric states: Secondary | ICD-10-CM | POA: Diagnosis not present

## 2023-09-12 DIAGNOSIS — Z13220 Encounter for screening for lipoid disorders: Secondary | ICD-10-CM

## 2023-09-12 DIAGNOSIS — Z0000 Encounter for general adult medical examination without abnormal findings: Secondary | ICD-10-CM | POA: Diagnosis not present

## 2023-09-12 DIAGNOSIS — Z1211 Encounter for screening for malignant neoplasm of colon: Secondary | ICD-10-CM

## 2023-09-12 DIAGNOSIS — Z1231 Encounter for screening mammogram for malignant neoplasm of breast: Secondary | ICD-10-CM

## 2023-09-12 DIAGNOSIS — R399 Unspecified symptoms and signs involving the genitourinary system: Secondary | ICD-10-CM | POA: Diagnosis not present

## 2023-09-12 LAB — COMPREHENSIVE METABOLIC PANEL WITH GFR
ALT: 19 U/L (ref 0–35)
AST: 14 U/L (ref 0–37)
Albumin: 4.5 g/dL (ref 3.5–5.2)
Alkaline Phosphatase: 79 U/L (ref 39–117)
BUN: 10 mg/dL (ref 6–23)
CO2: 28 meq/L (ref 19–32)
Calcium: 9.5 mg/dL (ref 8.4–10.5)
Chloride: 102 meq/L (ref 96–112)
Creatinine, Ser: 0.62 mg/dL (ref 0.40–1.20)
GFR: 107.6 mL/min (ref >60.00)
Glucose, Bld: 90 mg/dL (ref 70–99)
Potassium: 4.1 meq/L (ref 3.5–5.1)
Sodium: 139 meq/L (ref 135–145)
Total Bilirubin: 0.5 mg/dL (ref 0.2–1.2)
Total Protein: 7.5 g/dL (ref 6.0–8.3)

## 2023-09-12 LAB — TSH: TSH: 0.94 u[IU]/mL (ref 0.35–5.50)

## 2023-09-12 LAB — CBC WITH DIFFERENTIAL/PLATELET
Basophils Absolute: 0 10*3/uL (ref 0.0–0.1)
Basophils Relative: 0.5 % (ref 0.0–3.0)
Eosinophils Absolute: 0.1 10*3/uL (ref 0.0–0.7)
Eosinophils Relative: 0.7 % (ref 0.0–5.0)
HCT: 38.9 % (ref 36.0–46.0)
Hemoglobin: 12.8 g/dL (ref 12.0–15.0)
Lymphocytes Relative: 27.2 % (ref 12.0–46.0)
Lymphs Abs: 2 10*3/uL (ref 0.7–4.0)
MCHC: 32.8 g/dL (ref 30.0–36.0)
MCV: 78.1 fl (ref 78.0–100.0)
Monocytes Absolute: 0.4 10*3/uL (ref 0.1–1.0)
Monocytes Relative: 4.9 % (ref 3.0–12.0)
Neutro Abs: 4.8 10*3/uL (ref 1.4–7.7)
Neutrophils Relative %: 66.7 % (ref 43.0–77.0)
Platelets: 286 10*3/uL (ref 150.0–400.0)
RBC: 4.99 Mil/uL (ref 3.87–5.11)
RDW: 13.8 % (ref 11.5–15.5)
WBC: 7.2 10*3/uL (ref 4.0–10.5)

## 2023-09-12 LAB — LIPID PANEL
Cholesterol: 186 mg/dL (ref 0–200)
HDL: 59.8 mg/dL (ref >39.00)
LDL Cholesterol: 105 mg/dL — ABNORMAL HIGH (ref 0–99)
NonHDL: 126.55
Total CHOL/HDL Ratio: 3
Triglycerides: 107 mg/dL (ref 0.0–149.0)
VLDL: 21.4 mg/dL (ref 0.0–40.0)

## 2023-09-12 NOTE — Patient Instructions (Signed)
 Please return in 12 months for your annual complete physical; please come fasting.   I will release your lab results to you on your MyChart account with further instructions. You may see the results before I do, but when I review them I will send you a message with my report or have my assistant call you if things need to be discussed. Please reply to my message with any questions. Thank you!   If you have any questions or concerns, please don't hesitate to send me a message via MyChart or call the office at [phone]. Thank you for visiting with us  today! It's our pleasure caring for you.    VISIT SUMMARY: Today, we discussed your concerns about weight loss and preparing for menopause. We also reviewed your past urinary symptoms and general health maintenance needs.  YOUR PLAN: -MENOPAUSAL SYMPTOMS: You are experiencing perimenopausal symptoms, which include irregular menstrual cycles and cognitive changes. We recommend monitoring your menstrual cycle and symptoms, and addressing specific symptoms as they arise. Regular exercise and a healthy lifestyle can help manage these symptoms.  -WEIGHT MANAGEMENT CONCERNS: You are having difficulty losing weight despite a healthy diet. Increasing the intensity and variety of your exercise routine is important for boosting your metabolism. We discussed the possibility of using Contrave for appetite control if needed.  -URINARY SYMPTOMS: You had a past episode of hematuria (blood in urine) associated with a urinary tract infection (UTI) last year, with no recurrence since. We recommend monitoring for any recurrent urinary symptoms and discussing further testing with your urogynecologist if symptoms persist.  -GENERAL HEALTH MAINTENANCE: Your Pap smear is up to date. We recommend a colon cancer screening and a mammogram. We will order a Cologuard test for colon cancer screening and schedule a mammogram at [facility].  INSTRUCTIONS: Please monitor your  menstrual cycle and any symptoms you experience. Increase the intensity and variety of your exercise routine. If you experience any recurrent urinary symptoms, discuss further testing with your urogynecologist. Complete the Cologuard test for colon cancer screening and schedule your mammogram at [facility].                      Contains text generated by Abridge.                                 Contains text generated by Abridge.

## 2023-09-12 NOTE — Progress Notes (Signed)
 Subjective  Chief Complaint  Patient presents with   Annual Exam    Pt here for Annual Exam and is currently fasting     HPI: Brandi Freeman is a 43 y.o. female who presents to [facility] [place] today for a Female Wellness Visit. She also has the concerns and/or needs as listed above in the chief complaint. These will be addressed in addition to the Health Maintenance Visit.   Wellness Visit: annual visit with health maintenance review and exam  HM: healthy 43 yo w/ history of very regular periods every 28 days, now occurring monthly but not exactly every 28 days. She is worried about menopause. Having a hard time losing weight and sometimes doesn't remember as well as before. No hot flushes. Uses rhythm method for birth control. [id number], son is 74. Happy. Non smoker Pap smear current and no h/o abnormals. Due mammogram and eligible for CRC screen. Avg risk.  Chronic disease f/u and/or acute problem visit: (deemed necessary to be done in addition to the wellness visit): Reviewed recent urogyn notes: has lower tract irritative sxs w/o infection. H/o gross hematuria x 1 w/ sxs but reportedly negative culture. Needs hematuria eval but is hesitant.   Assessment  1. Annual physical exam   2. Lower urinary tract symptoms   3. Perimenopausal   4. Screening for colorectal cancer   5. Screening mammogram for breast cancer   6. Need for hepatitis C screening test      Plan  Female Wellness Visit: Age appropriate Health Maintenance and Prevention measures were discussed with patient. Included topics are cancer screening recommendations, ways to keep healthy (see AVS) including dietary and exercise recommendations, regular eye and dental care, use of seat belts, and avoidance of moderate alcohol use and tobacco use.  BMI: discussed patient's BMI and encouraged positive lifestyle modifications to help get to or maintain a target BMI. HM needs and immunizations were addressed and  ordered. See below for orders. See HM and immunization section for updates. Routine labs and screening tests ordered including cmp, cbc and lipids where appropriate. Discussed recommendations regarding Vit D and calcium supplementation (see AVS)  Chronic disease management visit and/or acute problem visit: Discussed perimenopause. Discussed weight loss strategies; eats healthy. Rec increasing exercise. Monitor cycles. Reassured.  Schedule mammogram Cologuard for crc screen elected. Education given.  F/u with urogyn  Follow up: 12 mo for cpe  Orders Placed This Encounter  Procedures   MM DIGITAL SCREENING BILATERAL   HM PAP SMEAR   Cologuard   CBC with Differential/Platelet   Comprehensive metabolic panel with GFR   Lipid panel   TSH   Hepatitis C antibody   No orders of the defined types were placed in this encounter.     Body mass index is 33.62 kg/m. Wt Readings from Last 3 Encounters:  09/12/23 183 lb 12.8 oz (83.4 kg)  08/31/23 182 lb 3.2 oz (82.6 kg)  10/04/22 178 lb (80.7 kg)     Patient Active Problem List   Diagnosis Date Noted Date Diagnosed   Perimenopausal 09/12/2023    Gross hematuria 08/31/2023    Lower urinary tract symptoms 08/31/2023    Constipation 08/31/2023    ETD (Eustachian tube dysfunction), bilateral 01/03/2019    Acne vulgaris 10/07/2015    Health Maintenance  Topic Date Due   Hepatitis C Screening  Never done   Fecal DNA (Cologuard)  Never done   COVID-19 Vaccine (4 - 2024-25 season) 09/28/2023 (Originally 12/24/2022)  INFLUENZA VACCINE  11/23/2023   Cervical Cancer Screening (HPV/Pap Cotest)  04/19/2028   DTaP/Tdap/Td (4 - Td or Tdap) 06/21/2032   HIV Screening  Completed   HPV VACCINES  Aged Out   Meningococcal B Vaccine  Aged Out   Immunization History  Administered Date(s) Administered   Hepatitis B, ADULT 04/25/1993, 05/26/1993, 10/23/1993, 01/18/2010, 06/27/2022   Influenza, Quadrivalent, Recombinant, Inj, Pf 01/19/2016    Influenza,inj,Quad PF,6+ Mos 01/17/2020, 02/08/2021, 01/24/2022, 02/13/2023   Influenza-Unspecified 04/01/2015, 01/01/2017   MMR 03/03/2002, 08/16/2011, 11/18/2011   PFIZER(Purple Top)SARS-COV-2 Vaccination 08/29/2019, 09/18/2019, 04/16/2020   PPD Test 12/11/2017   Tdap 04/25/2011, 11/08/2015, 06/22/2022   Varicella 06/27/2022   We updated and reviewed the patient's past history in detail and it is documented below. Allergies: Patient has no known allergies. Past Medical History Patient  has no past medical history on file. Past Surgical History Patient  has a past surgical history that includes Cesarean section. Family History: Patient family history includes Diabetes in her maternal grandfather. Social History:  Patient  reports that she has never smoked. She has never used smokeless tobacco. She reports that she does not currently use alcohol. She reports that she does not use drugs.  Review of Systems: Constitutional: negative for fever or malaise Ophthalmic: negative for photophobia, double vision or loss of vision Cardiovascular: negative for chest pain, dyspnea on exertion, or new LE swelling Respiratory: negative for SOB or persistent cough Gastrointestinal: negative for abdominal pain, change in bowel habits or melena Genitourinary: negative for dysuria or gross hematuria, no abnormal uterine bleeding or disharge Musculoskeletal: negative for new gait disturbance or muscular weakness Integumentary: negative for new or persistent rashes, no breast lumps Neurological: negative for TIA or stroke symptoms Psychiatric: negative for SI or delusions Allergic/Immunologic: negative for hives  Patient Care Team    Relationship Specialty Notifications Start End  Luevenia Saha, MD PCP - General Family Medicine  08/31/23   Daryel Ensign, DO Consulting Physician Neurology  10/04/22   Darlene Ehlers, MD Consulting Physician Urology  08/31/23    Comment: Urogynecology    Objective   Vitals: BP 118/64   Temp 97.7 F (36.5 C)   Ht 5\' 2"  (1.575 m)   Wt 183 lb 12.8 oz (83.4 kg)   BMI 33.62 kg/m  General:  Well developed, well nourished, no acute distress  Psych:  Alert and orientedx3,normal mood and affect HEENT:  Normocephalic, atraumatic, non-icteric sclera,  supple neck without adenopathy, mass or thyromegaly Cardiovascular:  Normal S1, S2, RRR without gallop, rub or murmur Respiratory:  Good breath sounds bilaterally, CTAB with normal respiratory effort Gastrointestinal: normal bowel sounds, soft, non-tender, no noted masses. No HSM MSK: extremities without edema, joints without erythema or swelling Neurologic:    Mental status is normal.  Gross motor and sensory exams are normal.  No tremor  Commons side effects, risks, benefits, and alternatives for medications and treatment plan prescribed today were discussed, and the patient expressed understanding of the given instructions. Patient is instructed to call or message via MyChart if he/she has any questions or concerns regarding our treatment plan. No barriers to understanding were identified. We discussed Red Flag symptoms and signs in detail. Patient expressed understanding regarding what to do in case of urgent or emergency type symptoms.  Medication list was reconciled, printed and provided to the patient in AVS. Patient instructions and summary information was reviewed with the patient as documented in the AVS. This note was prepared with assistance of Dragon  voice recognition software. Occasional wrong-word or sound-a-like substitutions may have occurred due to the inherent limitations of voice recognition software

## 2023-09-13 ENCOUNTER — Ambulatory Visit: Payer: Self-pay | Admitting: Family Medicine

## 2023-09-13 LAB — HEPATITIS C ANTIBODY: Hepatitis C Ab: NONREACTIVE

## 2023-09-13 NOTE — Progress Notes (Signed)
 Please call patient:ask her to sign up for mychart.  And let her know all of her lab results are normal! Everything looks good! The 10-year ASCVD risk score (Arnett DK, et al., 2019) is: 0.5%   Values used to calculate the score:     Age: 43 years     Sex: Female     Is Non-Hispanic African American: No     Diabetic: No     Tobacco smoker: No     Systolic Blood Pressure: 118 mmHg     Is BP treated: No     HDL Cholesterol: 59.8 mg/dL     Total Cholesterol: 186 mg/dL

## 2023-09-20 ENCOUNTER — Encounter: Payer: Self-pay | Admitting: Family Medicine

## 2023-09-20 DIAGNOSIS — Z1211 Encounter for screening for malignant neoplasm of colon: Secondary | ICD-10-CM | POA: Diagnosis not present

## 2023-09-20 DIAGNOSIS — Z1212 Encounter for screening for malignant neoplasm of rectum: Secondary | ICD-10-CM | POA: Diagnosis not present

## 2023-10-01 ENCOUNTER — Ambulatory Visit: Admitting: Family Medicine

## 2023-10-01 ENCOUNTER — Encounter: Payer: Self-pay | Admitting: Family Medicine

## 2023-10-01 VITALS — BP 100/61 | HR 80 | Temp 97.8°F | Ht 62.0 in | Wt 184.4 lb

## 2023-10-01 DIAGNOSIS — R197 Diarrhea, unspecified: Secondary | ICD-10-CM

## 2023-10-01 DIAGNOSIS — K219 Gastro-esophageal reflux disease without esophagitis: Secondary | ICD-10-CM | POA: Diagnosis not present

## 2023-10-01 DIAGNOSIS — E669 Obesity, unspecified: Secondary | ICD-10-CM | POA: Diagnosis not present

## 2023-10-01 MED ORDER — NALTREXONE-BUPROPION HCL ER 8-90 MG PO TB12: ORAL_TABLET | ORAL | 5 refills | Status: DC

## 2023-10-01 MED ORDER — PANTOPRAZOLE SODIUM 20 MG PO TBEC: 20.0000 mg | DELAYED_RELEASE_TABLET | Freq: Every day | ORAL | 2 refills | Status: DC

## 2023-10-03 ENCOUNTER — Ambulatory Visit
Admission: RE | Admit: 2023-10-03 | Discharge: 2023-10-03 | Disposition: A | Discharge status: Home / Self Care | Source: Ambulatory Visit | Attending: Family Medicine | Admitting: Family Medicine

## 2023-10-03 DIAGNOSIS — Z0000 Encounter for general adult medical examination without abnormal findings: Secondary | ICD-10-CM

## 2023-10-03 DIAGNOSIS — Z1231 Encounter for screening mammogram for malignant neoplasm of breast: Secondary | ICD-10-CM

## 2023-10-05 ENCOUNTER — Other Ambulatory Visit (HOSPITAL_COMMUNITY): Payer: Self-pay

## 2023-10-08 ENCOUNTER — Other Ambulatory Visit: Payer: Self-pay | Admitting: Family Medicine

## 2023-10-08 ENCOUNTER — Encounter: Payer: Self-pay | Admitting: Family Medicine

## 2023-10-08 DIAGNOSIS — R928 Other abnormal and inconclusive findings on diagnostic imaging of breast: Secondary | ICD-10-CM

## 2023-10-09 ENCOUNTER — Telehealth: Payer: Self-pay

## 2023-10-09 NOTE — Telephone Encounter (Signed)
 Patient has cancelled the CT due to her cost. She will let us  know when she would like to move forward. Cysto is scheduled for her cysto 12-20-2023.

## 2023-10-09 NOTE — Progress Notes (Signed)
Negative cologuard. Mc note sent

## 2023-10-10 DIAGNOSIS — E669 Obesity, unspecified: Secondary | ICD-10-CM | POA: Insufficient documentation

## 2023-10-10 NOTE — Progress Notes (Signed)
 Subjective  CC:  Chief Complaint  Patient presents with   Abdominal Pain    Pt state that last week she started experiencing some abdominal pain along with stomach burning and leg pain. She stated that she ate some cherries that she feels that contributed to the abdominal pain and also some diarrhea    HPI: Brandi Freeman is a 43 y.o. female who presents to the office today to address the problems listed above in the chief complaint. Discussed the use of AI scribe software for clinical note transcription with the patient, who gave verbal consent to proceed.  History of Present Illness Brandi Freeman is a 43 year old female who presents with abdominal pain and diarrhea.  She has been experiencing a severe burning stomach ache for the past week, affecting both the upper and lower abdomen. The pain is related to high stomach acid levels and worsens when her stomach is empty, with relief upon eating. She has a history of similar episodes occurring approximately once a year, often attributed to consuming harsh foods or fasting for extended periods. Previously, she has used pantoprazole  with effective relief, but she only had a limited supply recently.  In addition to abdominal pain, she experienced significant diarrhea last week, which she believes may have been triggered by consuming a large quantity of sour cherries. The diarrhea was persistent throughout the day. No nausea, vomiting, or fever. She has been taking multivitamins, including B12, magnesium, and iron, to address potential deficiencies.  She reports aching in her knees, described as a weakness or fatigue rather than bone pain. This knee pain has occurred in the past during periods of significant weight gain, such as during pregnancy.  She has been practicing intermittent fasting, fasting for 18 hours at a time, in an effort to lose weight. This regimen has been followed for five to six months, resulting in a net weight loss of four pounds,  but she finds the fasting difficult and tiring. She reports that when she stops fasting, she quickly regains weight.   Assessment  1. Gastroesophageal reflux disease, unspecified whether esophagitis present   2. Diarrhea, unspecified type   3. Obesity (BMI 30-39.9)      Plan  Assessment and Plan Assessment & Plan Gastroesophageal Reflux Disease (GERD) Symptoms consistent with GERD, likely triggered by dietary choices and prolonged fasting. Relieved with pantoprazole . - Prescribed pantoprazole  for 4 to 6 weeks. - Advised bland diet and avoiding prolonged fasting. - Encouraged hydration.  Diarrhea Likely due to dietary choices, specifically sour cherries. Contributed to dehydration. - Advised hydration. - Delayed weight loss medication until resolution.  Knee Pain Aching possibly related to dehydration from diarrhea. - Reassess if symptoms persist or worsen.  Weight Management Prolonged fasting ineffective for sustainable weight loss. Discussed Contrave  for managing cravings and additional benefits. Discussed cost implications. - Prescribe Contrave  post-resolution of gastrointestinal symptoms. - Advised healthy eating and regular exercise. - Ensured awareness of medication costs.    Follow up: 3 mo No orders of the defined types were placed in this encounter.  Meds ordered this encounter  Medications   Naltrexone -buPROPion  HCl ER 8-90 MG TB12    Sig: Start 1 tablet every morning for 7 days, then 1 tablet twice daily for 7 days, then 2 tablets every morning and one in the evening x 7 days, then 2 tab twice a day    Dispense:  120 tablet    Refill:  5   pantoprazole  (PROTONIX ) 20 MG tablet  Sig: Take 1 tablet (20 mg total) by mouth daily.    Dispense:  30 tablet    Refill:  2     I reviewed the patients updated PMH, FH, and SocHx.  Patient Active Problem List   Diagnosis Date Noted   Obesity (BMI 30-39.9) 10/10/2023   Perimenopausal 09/12/2023   Gross hematuria  08/31/2023   Lower urinary tract symptoms 08/31/2023   Constipation 08/31/2023   ETD (Eustachian tube dysfunction), bilateral 01/03/2019   Acne vulgaris 10/07/2015   Current Meds  Medication Sig   Naltrexone -buPROPion  HCl ER 8-90 MG TB12 Start 1 tablet every morning for 7 days, then 1 tablet twice daily for 7 days, then 2 tablets every morning and one in the evening x 7 days, then 2 tab twice a day   pantoprazole  (PROTONIX ) 20 MG tablet Take 1 tablet (20 mg total) by mouth daily.   Allergies: Patient has no known allergies. Family History: Patient family history includes Diabetes in her maternal grandfather. Social History:  Patient  reports that she has never smoked. She has never used smokeless tobacco. She reports that she does not currently use alcohol. She reports that she does not use drugs.  Review of Systems: Constitutional: Negative for fever malaise or anorexia Cardiovascular: negative for chest pain Respiratory: negative for SOB or persistent cough Gastrointestinal: negative for abdominal pain  Objective  Vitals: BP 100/61   Pulse 80   Temp 97.8 F (36.6 C)   Ht 5' 2 (1.575 m)   Wt 184 lb 6.4 oz (83.6 kg)   SpO2 100%   BMI 33.73 kg/m  General: no acute distress , A&Ox3 HEENT: PEERL, conjunctiva normal, neck is supple Cardiovascular:  RRR without murmur or gallop.  Respiratory:  Good breath sounds bilaterally, CTAB with normal respiratory effort Gastrointestinal: soft, flat abdomen, normal active bowel sounds, no palpable masses, no hepatosplenomegaly, no appreciated hernias Mild midepigastric ttp Skin:  Warm, no rashes Commons side effects, risks, benefits, and alternatives for medications and treatment plan prescribed today were discussed, and the patient expressed understanding of the given instructions. Patient is instructed to call or message via MyChart if he/she has any questions or concerns regarding our treatment plan. No barriers to understanding were  identified. We discussed Red Flag symptoms and signs in detail. Patient expressed understanding regarding what to do in case of urgent or emergency type symptoms.  Medication list was reconciled, printed and provided to the patient in AVS. Patient instructions and summary information was reviewed with the patient as documented in the AVS. This note was prepared with assistance of Dragon voice recognition software. Occasional wrong-word or sound-a-like substitutions may have occurred due to the inherent limitations of voice recognition software

## 2023-10-12 ENCOUNTER — Other Ambulatory Visit (HOSPITAL_COMMUNITY): Payer: Self-pay

## 2023-10-12 ENCOUNTER — Telehealth: Payer: Self-pay

## 2023-10-12 ENCOUNTER — Ambulatory Visit

## 2023-10-12 NOTE — Telephone Encounter (Signed)
 Pharmacy Patient Advocate Encounter   Received notification from RX Request Messages that prior authorization for Contrave  8-90MG  er tablets is required/requested.   Insurance verification completed.   The patient is insured through [facility] .   Per test claim: PA required; PA submitted to above mentioned insurance via CoverMyMeds Key/confirmation #/EOC [id number] Status is pending

## 2023-10-15 NOTE — Telephone Encounter (Signed)
 Noted

## 2023-10-17 ENCOUNTER — Other Ambulatory Visit (HOSPITAL_COMMUNITY): Payer: Self-pay

## 2023-10-17 NOTE — Telephone Encounter (Signed)
 Pharmacy Patient Advocate Encounter  Received notification from [facility] that Prior Authorization for Contrave  8-90MCG ER TABS has been APPROVED from 10/16/23 to 02/13/24   PA #/Case ID/Reference #: [id number]

## 2023-11-01 ENCOUNTER — Other Ambulatory Visit (HOSPITAL_COMMUNITY): Payer: Self-pay

## 2023-11-01 MED ORDER — NALTREXONE-BUPROPION HCL ER 8-90 MG PO TB12: ORAL_TABLET | ORAL | 5 refills | Status: AC

## 2023-11-01 MED ORDER — PANTOPRAZOLE SODIUM 20 MG PO TBEC
20.0000 mg | DELAYED_RELEASE_TABLET | Freq: Every day | ORAL | 2 refills | Status: DC
  Filled 2023-11-01: qty 30, 30d supply, fill #0

## 2023-11-05 ENCOUNTER — Other Ambulatory Visit (HOSPITAL_COMMUNITY): Payer: Self-pay

## 2023-12-14 ENCOUNTER — Ambulatory Visit: Admitting: Obstetrics

## 2023-12-20 ENCOUNTER — Other Ambulatory Visit: Admitting: Obstetrics

## 2023-12-27 ENCOUNTER — Ambulatory Visit: Admitting: Obstetrics

## 2024-02-07 ENCOUNTER — Ambulatory Visit
Admission: RE | Admit: 2024-02-07 | Discharge: 2024-02-07 | Disposition: A | Discharge status: Home / Self Care | Source: Ambulatory Visit | Attending: Family Medicine | Admitting: Family Medicine

## 2024-02-07 DIAGNOSIS — R928 Other abnormal and inconclusive findings on diagnostic imaging of breast: Secondary | ICD-10-CM

## 2024-02-07 DIAGNOSIS — N6001 Solitary cyst of right breast: Secondary | ICD-10-CM | POA: Diagnosis not present

## 2024-02-11 ENCOUNTER — Encounter: Payer: Self-pay | Admitting: Family Medicine

## 2024-02-11 ENCOUNTER — Ambulatory Visit (INDEPENDENT_AMBULATORY_CARE_PROVIDER_SITE_OTHER): Admitting: Family Medicine

## 2024-02-11 VITALS — BP 108/78 | HR 90 | Temp 97.9°F | Ht 62.0 in | Wt 171.6 lb

## 2024-02-11 DIAGNOSIS — R202 Paresthesia of skin: Secondary | ICD-10-CM

## 2024-02-11 DIAGNOSIS — R200 Anesthesia of skin: Secondary | ICD-10-CM

## 2024-02-11 DIAGNOSIS — J320 Chronic maxillary sinusitis: Secondary | ICD-10-CM

## 2024-02-11 MED ORDER — FLUTICASONE PROPIONATE 50 MCG/ACT NA SUSP: 1.0000 | Freq: Every day | NASAL | 6 refills | Status: AC

## 2024-02-11 MED ORDER — AMOXICILLIN-POT CLAVULANATE 875-125 MG PO TABS: 1.0000 | ORAL_TABLET | Freq: Two times a day (BID) | ORAL | 0 refills | Status: AC

## 2024-02-11 NOTE — Progress Notes (Signed)
 Subjective  CC:  Chief Complaint  Patient presents with   Headache    Pt stated that she is having some Lt side head pain for the past 2 weeks     HPI: Brandi Freeman is a 43 y.o. female who presents to the office today to address the problems listed above in the chief complaint. Discussed the use of AI scribe software for clinical note transcription with the patient, who gave verbal consent to proceed.  History of Present Illness Brandi Freeman is a 43 year old female with trigeminal neuralgia who presents with facial pain and tingling following a dental procedure.  Facial pain and paresthesia - Significant facial pain and tingling following a dental filling procedure performed approximately one month ago - Initial swelling resolved after completing a course of antibiotics prescribed by her dentist - Persistent pain and tingling in the jaw and face - Sensation of pressure behind the eye - Pain radiates to the back of the head - Chills occur two to three times daily, lasting two to three minutes each episode - No burning pain or congestion - History of similar pain previously diagnosed as trigeminal neuralgia  Medication side effects and adherence - Gabapentin  previously improved trigeminal neuralgia symptoms - Concern for gabapentin  side effects, described as 'really bad' and 'depressing' - Discontinued a previously prescribed medication after two weeks due to high cost and eye pain  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 Left maxillary sinusitis   2. Numbness and tingling of left side of face      Plan  Assessment and Plan Assessment & Plan Acute sinusitis Presents with facial pain, pressure behind the eye, and chills for two to three weeks, suggestive  of acute sinusitis. No congestion reported. Differential diagnosis includes trigeminal neuralgia and sinus infection. Previous doxycycline treatment was ineffective. Prefers not to use amoxicillin due to past ineffectiveness. - Prescribe Augmentin for sinusitis - Recommend nasal spray for symptom relief - Consider gabapentin  at night if pain persists and is suspected to be nerve-related (trigeminal neuralgia is on the differential)  Trigeminal neuralgia, possible recurrence Reports tingling and numbness from the jaw to the face, occurring two to three times a day for two to three weeks. Symptoms are reminiscent of previous trigeminal neuralgia treated with gabapentin . No burning pain reported. Symptoms may be related to recent dental work and possible nerve irritation. - Consider gabapentin  at night if symptoms persist after sinusitis treatment  Breast pain, cyclic (mastalgia) Reports long-standing breast pain radiating to the left arm, worsening before menstruation. Pain is likely cyclic mastalgia related to hormonal changes. No evidence of malignancy on recent ultrasound, though there was a discrepancy in the report regarding the side of the breast examined. - Recommend Advil for pain management - Reassure that long-standing nature and cyclic pattern suggest benign etiology    Follow up: prn No orders of the defined types were placed in this encounter.  Meds ordered this encounter  Medications   amoxicillin-clavulanate (AUGMENTIN) 875-125 MG tablet    Sig: Take 1 tablet by mouth 2 (two) times daily for 10 days.    Dispense:  20 tablet    Refill:  0   fluticasone (FLONASE) 50 MCG/ACT nasal spray    Sig: Place 1 spray into both nostrils daily.  Dispense:  16 g    Refill:  6     I reviewed the patients updated PMH, FH, and SocHx.  Patient Active Problem List   Diagnosis Date Noted   Obesity (BMI 30-39.9) 10/10/2023   Perimenopausal 09/12/2023   Gross hematuria 08/31/2023   Lower  urinary tract symptoms 08/31/2023   Constipation 08/31/2023   ETD (Eustachian tube dysfunction), bilateral 01/03/2019   Acne vulgaris 10/07/2015   Current Meds  Medication Sig   amoxicillin-clavulanate (AUGMENTIN) 875-125 MG tablet Take 1 tablet by mouth 2 (two) times daily for 10 days.   fluticasone (FLONASE) 50 MCG/ACT nasal spray Place 1 spray into both nostrils daily.   Allergies: Patient has no known allergies. Family History: Patient family history includes Diabetes in her maternal grandfather. Social History:  Patient  reports that she has never smoked. She has never used smokeless tobacco. She reports that she does not currently use alcohol. She reports that she does not use drugs.  Review of Systems: Constitutional: Negative for fever malaise or anorexia Cardiovascular: negative for chest pain Respiratory: negative for SOB or persistent cough Gastrointestinal: negative for abdominal pain  Objective  Vitals: BP 108/78   Pulse 90   Temp 97.9 F (36.6 C)   Ht 5' 2 (1.575 m)   Wt 171 lb 9.6 oz (77.8 kg)   SpO2 99%   BMI 31.39 kg/m  General: no acute distress , A&Ox3 HEENT: PEERL, conjunctiva normal, neck is supple, TM nl bilateral, left sinus ttp w/ some selling, no photophobia. No rash, no cervical LAD Cardiovascular:  RRR without murmur or gallop.  Respiratory:  Good breath sounds bilaterally, CTAB with normal respiratory effort Skin:  Warm, no rashes Commons side effects, risks, benefits, and alternatives for medications and treatment plan prescribed today were discussed, and the patient expressed understanding of the given instructions. Patient is instructed to call or message via MyChart if he/she has any questions or concerns regarding our treatment plan. No barriers to understanding were identified. We discussed Red Flag symptoms and signs in detail. Patient expressed understanding regarding what to do in case of urgent or emergency type symptoms.  Medication list  was reconciled, printed and provided to the patient in AVS. Patient instructions and summary information was reviewed with the patient as documented in the AVS. This note was prepared with assistance of Dragon voice recognition software. Occasional wrong-word or sound-a-like substitutions may have occurred due to the inherent limitations of voice recognition software

## 2024-02-11 NOTE — Patient Instructions (Signed)
 Please follow up if symptoms do not improve or as needed.     VISIT SUMMARY: During today's visit, we discussed your facial pain and tingling following a dental procedure, breast pain associated with your menstrual cycle, and recent unintentional weight loss. We also reviewed your general health maintenance and necessary screenings.  YOUR PLAN: -ACUTE SINUSITIS: Acute sinusitis is an infection or inflammation of the sinuses, which can cause facial pain, pressure, and chills. We will treat this with a prescription for Augmentin and recommend using a nasal spray for symptom relief. If the pain persists and is suspected to be nerve-related, consider taking gabapentin  at night.  -TRIGEMINAL NEURALGIA, POSSIBLE RECURRENCE: Trigeminal neuralgia is a chronic pain condition affecting the trigeminal nerve in the face. Your symptoms may be related to recent dental work and possible nerve irritation. If symptoms persist after treating the sinusitis, consider taking gabapentin  at night.  -BREAST PAIN, CYCLIC (MASTALGIA): Cyclic mastalgia is breast pain associated with the menstrual cycle, often due to hormonal changes. We recommend taking Advil for pain management and reassure you that the long-standing nature and cyclic pattern suggest a benign cause.  -GENERAL HEALTH MAINTENANCE: Your recent physical in April was up to date, with slightly elevated cholesterol noted. Continue with annual physical exams and monitor your cholesterol levels.  INSTRUCTIONS: Please follow up with us  if your symptoms do not improve after completing the prescribed treatments. Continue with your annual physical exams and monitor your cholesterol levels as discussed.                      Contains text generated by Abridge.                                 Contains text generated by Abridge.

## 2024-02-19 ENCOUNTER — Encounter: Payer: Self-pay | Admitting: Family Medicine

## 2024-02-20 MED ORDER — PANTOPRAZOLE SODIUM 40 MG PO TBEC
40.0000 mg | DELAYED_RELEASE_TABLET | Freq: Every day | ORAL | 3 refills | Status: AC
  Filled 2024-03-31: qty 30, 30d supply, fill #0
  Filled 2024-04-23: qty 30, 30d supply, fill #1

## 2024-02-21 ENCOUNTER — Other Ambulatory Visit: Payer: Self-pay | Admitting: Family Medicine

## 2024-02-21 DIAGNOSIS — H5712 Ocular pain, left eye: Secondary | ICD-10-CM

## 2024-02-21 DIAGNOSIS — R202 Paresthesia of skin: Secondary | ICD-10-CM

## 2024-02-21 DIAGNOSIS — R519 Headache, unspecified: Secondary | ICD-10-CM

## 2024-02-25 ENCOUNTER — Ambulatory Visit
Admission: RE | Admit: 2024-02-25 | Discharge: 2024-02-25 | Disposition: A | Discharge status: Home / Self Care | Source: Ambulatory Visit | Attending: Family Medicine | Admitting: Family Medicine

## 2024-02-25 DIAGNOSIS — R519 Headache, unspecified: Secondary | ICD-10-CM

## 2024-02-25 DIAGNOSIS — H5712 Ocular pain, left eye: Secondary | ICD-10-CM

## 2024-02-28 ENCOUNTER — Other Ambulatory Visit

## 2024-03-03 ENCOUNTER — Ambulatory Visit (INDEPENDENT_AMBULATORY_CARE_PROVIDER_SITE_OTHER): Admitting: Neurology

## 2024-03-03 ENCOUNTER — Encounter: Payer: Self-pay | Admitting: Neurology

## 2024-03-03 VITALS — BP 104/68 | HR 76 | Ht 62.0 in | Wt 173.0 lb

## 2024-03-03 DIAGNOSIS — G501 Atypical facial pain: Secondary | ICD-10-CM | POA: Diagnosis not present

## 2024-03-03 MED ORDER — DULOXETINE HCL 30 MG PO CPEP: 30.0000 mg | ORAL_CAPSULE | Freq: Every day | ORAL | 3 refills | Status: DC

## 2024-03-03 NOTE — Progress Notes (Signed)
 Follow-up Visit   Date: 03/03/2024    Shada Nienaber MRN: [id number] DOB: Jul 19, 1980    Brandi Freeman is a 43 y.o. right-handed female returning to the clinic for follow-up of left facial pain.  The patient was accompanied to the clinic by self.   IMPRESSION/PLAN: Right facial pain following dental work.  Pain is localized over the left upper gums, cheek and eye.  It's possible there may have been localized injury to the nerve in this area.  MRI brain is normal.   - Previously tried:  carbamazepine  200mg  BID (headache), gabapentin  300mg  (concerned about side effects)  - Start duloxetine 30mg  daily; update in 1 month to determine titration  - Consider seeing ENT for their opinion  MyChart update in 1 month  --------------------------------------------- History of present illness: Starting around 2022, she began having left ear pain, described as dull pain. Dull ear/jaw pain is every day and constant. She has spells of severe, sharp pain which can radiate towards her jar. This sharp pain can last for 5-6 hours and self-resolves. She has seen ENT and dentist which has been unrevealing. She was found to grind her teeth and was given a bite guard, however, she was unable to tolerate it. No evidence of TMJ. She has some relief with methocarbamol. She has been given prednisone taper which eases the pain, but once she has completed the course, pain returns. She has not found any lasting relief and is frustrated by the pain. No similar symptoms on the right side. No numbness or tingling. Symptoms are not triggered by chewing, talking, tactile stimulus of the face.   UPDATE 03/03/2024:   She reports having dental work in September 2025, she started having left facial burning around the left cheek and eye.  Sometimes, she has pain involving the right eye.  She also reports having pain over the left side of the head and ear.  She had MRI brain which is normal.  She tried gabapentin  which did not help.   She saw her dentist and had CT of the jaw which was normal.  She has tenderness around the gum on the left.  Her dentist offered antibiotics which has not helped.  She denies facial weakness.    She was seen for similar facial pain in 2024, which she reports felt very different and responded to gabapentin .  Her pain eventually resolved after having overbite corrected.   Medications:  Current Outpatient Medications on File Prior to Visit  Medication Sig Dispense Refill   fluticasone (FLONASE) 50 MCG/ACT nasal spray Place 1 spray into both nostrils daily. 16 g 6   pantoprazole  (PROTONIX ) 40 MG tablet Take 1 tablet (40 mg total) by mouth daily. (Patient not taking: Reported on 03/03/2024) 30 tablet 3   No current facility-administered medications on file prior to visit.    Allergies: No Known Allergies  Vital Signs:  BP 104/68   Pulse 76   Ht 5' 2 (1.575 m)   Wt 173 lb (78.5 kg)   SpO2 100%   BMI 31.64 kg/m    Neurological Exam: MENTAL STATUS including orientation to time, place, person, recent and remote memory, attention span and concentration, language, and fund of knowledge is normal.  Speech is not dysarthric.  CRANIAL NERVES:  Pupils equal round and reactive to light.  Normal conjugate, extra-ocular eye movements in all directions of gaze.  No ptosis.  Face is symmetric. Palate elevates symmetrically.  Tongue is midline.  MOTOR:  Motor strength is 5/5  in all extremities.  No atrophy, fasciculations or abnormal movements.  No pronator drift.  Tone is normal.    MSRs:  Reflexes are 2+/4 throughout.  SENSORY:  Intact to vibration throughout.  COORDINATION/GAIT:  Normal finger-to- nose-finger.  Intact rapid alternating movements bilaterally.  Gait narrow based and stable.   Data: MRI brain wo contrast 02/29/2024: 1. No acute intracranial abnormality.    Thank you for allowing me to participate in patient's care.  If I can answer any additional questions, I would be pleased  to do so.    Sincerely,    Marinda Tyer K. Tobie, DO

## 2024-03-03 NOTE — Patient Instructions (Signed)
 Start duloxetine 30mg  daily for one month, then sent Mychart update to see if we need to increase the dose.

## 2024-03-04 ENCOUNTER — Ambulatory Visit: Payer: Self-pay | Admitting: Family Medicine

## 2024-03-04 NOTE — Progress Notes (Signed)
 See mychart note Dear Brandi Freeman, Your Brain and sinus MRI results are all normal. This is GREAT news.  Perhaps the pain and swelling was related to the teeth. Are you feeling any better? Sincerely, Dr. Jodie

## 2024-03-07 ENCOUNTER — Other Ambulatory Visit

## 2024-03-12 DIAGNOSIS — H18533 Granular corneal dystrophy, bilateral: Secondary | ICD-10-CM | POA: Diagnosis not present

## 2024-03-12 DIAGNOSIS — H5713 Ocular pain, bilateral: Secondary | ICD-10-CM | POA: Diagnosis not present

## 2024-03-13 ENCOUNTER — Encounter: Payer: Self-pay | Admitting: Neurology

## 2024-03-17 MED ORDER — DULOXETINE HCL 30 MG PO CPEP: 30.0000 mg | ORAL_CAPSULE | Freq: Two times a day (BID) | ORAL | 3 refills | Status: AC

## 2024-03-31 ENCOUNTER — Other Ambulatory Visit (HOSPITAL_BASED_OUTPATIENT_CLINIC_OR_DEPARTMENT_OTHER): Payer: Self-pay

## 2024-03-31 MED ORDER — DULOXETINE HCL 30 MG PO CPEP
30.0000 mg | ORAL_CAPSULE | Freq: Every day | ORAL | 3 refills | Status: AC
  Filled 2024-03-31: qty 30, 30d supply, fill #0

## 2024-03-31 MED ORDER — OMRON 3 SERIES BP MONITOR DEVI
0 refills | Status: AC
  Filled 2024-03-31: qty 1, 30d supply, fill #0

## 2024-04-02 ENCOUNTER — Other Ambulatory Visit (HOSPITAL_COMMUNITY)
Admission: RE | Admit: 2024-04-02 | Discharge: 2024-04-02 | Disposition: A | Discharge status: Home / Self Care | Source: Ambulatory Visit | Attending: Family Medicine | Admitting: Family Medicine

## 2024-04-02 ENCOUNTER — Ambulatory Visit (INDEPENDENT_AMBULATORY_CARE_PROVIDER_SITE_OTHER): Admitting: Family Medicine

## 2024-04-02 ENCOUNTER — Encounter: Payer: Self-pay | Admitting: Family Medicine

## 2024-04-02 VITALS — BP 122/74 | HR 84 | Ht 62.0 in

## 2024-04-02 DIAGNOSIS — N898 Other specified noninflammatory disorders of vagina: Secondary | ICD-10-CM | POA: Diagnosis not present

## 2024-04-02 DIAGNOSIS — R1030 Lower abdominal pain, unspecified: Secondary | ICD-10-CM | POA: Diagnosis not present

## 2024-04-02 LAB — POC URINALSYSI DIPSTICK (AUTOMATED)
Bilirubin, UA: NEGATIVE
Blood, UA: NEGATIVE
Glucose, UA: NEGATIVE
Ketones, UA: NEGATIVE
Leukocytes, UA: NEGATIVE
Nitrite, UA: NEGATIVE
Protein, UA: NEGATIVE
Spec Grav, UA: 1.015 (ref 1.010–1.025)
Urobilinogen, UA: 0.2 U/dL
pH, UA: 5 (ref 5.0–8.0)

## 2024-04-02 MED ORDER — CIPROFLOXACIN HCL 500 MG PO TABS: 500.0000 mg | ORAL_TABLET | Freq: Two times a day (BID) | ORAL | 0 refills | Status: AC

## 2024-04-02 NOTE — Progress Notes (Signed)
 Subjective:     Patient ID: Brandi Freeman, female    DOB: Feb 07, 1981, 43 y.o.   MRN: [id number]  Chief Complaint  Patient presents with   Abdominal Pain    Pt is having lower abdominal pain with low back pain and discharge     Discussed the use of AI scribe software for clinical note transcription with the patient, who gave verbal consent to proceed.  History of Present Illness Brandi Freeman is a 43 year old female who presents with lower abdominal pain and irregular vaginal discharge.  She has been experiencing severe lower abdominal pain for the past week, radiating to her lower back. She began taking doxycycline from a previous prescription, but after four days, the pain persists.  She reports an irregular, clear vaginal discharge, which she finds unusual. Her last menstrual period was approximately ten days ago, and she denies any possibility of pregnancy. Initially, she experienced some itching before starting doxycycline, but this has since resolved.  She has a history of frequent urination, particularly after consuming liquids, but currently finds it difficult to urinate, requiring effort to initiate. No diarrhea, constipation, fevers, or chills.  She has been on multiple antibiotics recently due to severe pain in her cheek following dental work, which she believes may have caused nerve damage. Despite undergoing MRI, CT scans, and trying different antibiotics, the pain persists, particularly affecting her left face and eye. She is currently taking ibuprofen for pain management and duloxetine  for nerve pain, although duloxetine  does not alleviate her symptoms.  No history of ovarian cysts, diverticulitis, or sexually transmitted diseases. She has been with her husband for 23 years and does not use any birth control, relying on monitoring her ovulation and using condoms as needed. She is not allergic to any medications, but notes that amoxicillin  is usually ineffective for  her.    Health Maintenance Due  Topic Date Due   HPV VACCINES (1 - 3-dose SCDM series) Never done   Influenza Vaccine  11/23/2023   COVID-19 Vaccine (4 - 2025-26 season) 12/24/2023    History reviewed. No pertinent past medical history.  Past Surgical History:  Procedure Laterality Date   CESAREAN SECTION       Current Outpatient Medications:    ciprofloxacin (CIPRO) 500 MG tablet, Take 1 tablet (500 mg total) by mouth 2 (two) times daily for 10 days., Disp: 20 tablet, Rfl: 0   Blood Pressure Monitoring (OMRON 3 SERIES BP MONITOR) DEVI, Use as directed., Disp: 1 each, Rfl: 0   DULoxetine  (CYMBALTA ) 30 MG capsule, Take 1 capsule (30 mg total) by mouth 2 (two) times daily., Disp: 60 capsule, Rfl: 3   DULoxetine  (CYMBALTA ) 30 MG capsule, Take 1 capsule (30 mg total) by mouth daily., Disp: 30 capsule, Rfl: 3   fluticasone  (FLONASE ) 50 MCG/ACT nasal spray, Place 1 spray into both nostrils daily., Disp: 16 g, Rfl: 6   pantoprazole  (PROTONIX ) 40 MG tablet, Take 1 tablet (40 mg total) by mouth daily. (Patient not taking: Reported on 03/03/2024), Disp: 30 tablet, Rfl: 3  No Known Allergies ROS neg/noncontributory except as noted HPI/below      Objective:     BP 122/74 (BP Location: Left Arm, Patient Position: Sitting, Cuff Size: Normal)   Pulse 84   Ht 5' 2 (1.575 m)   SpO2 98%   BMI 31.64 kg/m  Wt Readings from Last 3 Encounters:  03/03/24 173 lb (78.5 kg)  02/11/24 171 lb 9.6 oz (77.8 kg)  10/01/23 184 lb  6.4 oz (83.6 kg)    Physical Exam GENERAL: Well developed, well nourished, no acute distress. HEAD EYES EARS NOSE THROAT: Normocephalic, atraumatic, conjunctiva not injected, sclera nonicteric. CARDIAC: Regular rate and rhythm, S1 S2 present, no murmur, ABDOMEN: Bowel sounds present, soft, lower abdomen tender, left CVA tenderness, non-distended, no hepatosplenomegaly, no masses. EXTREMITIES: No edema. MUSCULOSKELETAL: No gross abnormalities. NEUROLOGICAL: Alert and  oriented x3, cranial nerves II through XII intact. PSYCHIATRIC: Normal mood, good eye contact.  Results for orders placed or performed in visit on 04/02/24  POCT Urinalysis Dipstick (Automated)   Collection Time: 04/02/24  1:26 PM  Result Value Ref Range   Color, UA yellow    Clarity, UA clear    Glucose, UA Negative Negative   Bilirubin, UA neg    Ketones, UA neg    Spec Grav, UA 1.015 1.010 - 1.025   Blood, UA neg    pH, UA 5.0 5.0 - 8.0   Protein, UA Negative Negative   Urobilinogen, UA 0.2 0.2 or 1.0 E.U./dL   Nitrite, UA neg    Leukocytes, UA Negative Negative    U p neg      Assessment & Plan:  Lower abdominal pain -     Cervicovaginal ancillary only -     POCT Urinalysis Dipstick (Automated) -     Urine Culture  Vaginal discharge -     Cervicovaginal ancillary only  Other orders -     Ciprofloxacin HCl; Take 1 tablet (500 mg total) by mouth 2 (two) times daily for 10 days.  Dispense: 20 tablet; Refill: 0    Assessment and Plan Assessment & Plan Acute lower abdominal pain with possible urinary tract infection or acute pyelonephritis   She has experienced acute lower abdominal pain for one week, radiating to the lower back, accompanied by dysuria and increased urinary frequency. There is no fever, chills, diarrhea, or constipation. The differential diagnosis includes urinary tract infection and acute pyelonephritis or other abd path, with left CVA tenderness suggesting possible pyelonephritis. Recent antibiotic use may have contributed to symptoms, and a negative urine culture does not rule out pyelonephritis. Ordered urine analysis and culture. Prescribed ciprofloxacin. Monitor symptoms over the next few days. Low threshhold for CT  Vaginal discharge with possible acute vaginitis   She reports clear vaginal discharge, unusual for her, with initial itching prior to doxycycline use. There is no current itching, and possible acute vaginitis is considered. Sent self-swab  for culture.  Trigeminal neuralgia   She has chronic trigeminal neuralgia with severe facial and eye pain, following previous dental work and multiple specialist consultations. Current management includes duloxetine  and ibuprofen for pain relief. Duloxetine  causes insomnia if taken in the afternoon. Continue duloxetine  and ibuprofen for pain management.     Return if symptoms worsen or fail to improve.  Jenkins CHRISTELLA Carrel, MD

## 2024-04-02 NOTE — Patient Instructions (Signed)
 If worse pain, new symptoms, let us  know

## 2024-04-03 ENCOUNTER — Ambulatory Visit: Payer: Self-pay | Admitting: Family Medicine

## 2024-04-03 LAB — CERVICOVAGINAL ANCILLARY ONLY
Bacterial Vaginitis (gardnerella): NEGATIVE
Candida Glabrata: NEGATIVE
Candida Vaginitis: NEGATIVE
Chlamydia: NEGATIVE
Comment: NEGATIVE
Comment: NEGATIVE
Comment: NEGATIVE
Comment: NEGATIVE
Comment: NEGATIVE
Comment: NORMAL
Neisseria Gonorrhea: NEGATIVE
Trichomonas: NEGATIVE

## 2024-04-03 LAB — URINE CULTURE
MICRO NUMBER:: 17338216
Result:: NO GROWTH
SPECIMEN QUALITY:: ADEQUATE

## 2024-04-03 NOTE — Progress Notes (Signed)
 D/w pt at office today.  Doing better on cipro.  Will monitor

## 2024-04-07 ENCOUNTER — Other Ambulatory Visit: Payer: Self-pay | Admitting: Family Medicine

## 2024-04-07 ENCOUNTER — Ambulatory Visit (HOSPITAL_BASED_OUTPATIENT_CLINIC_OR_DEPARTMENT_OTHER)
Admission: RE | Admit: 2024-04-07 | Discharge: 2024-04-07 | Disposition: A | Source: Ambulatory Visit | Attending: Family Medicine | Admitting: Family Medicine

## 2024-04-07 DIAGNOSIS — R102 Pelvic and perineal pain unspecified side: Secondary | ICD-10-CM | POA: Insufficient documentation

## 2024-04-07 MED ORDER — TERCONAZOLE 0.4 % VA CREA: 1.0000 | TOPICAL_CREAM | Freq: Every day | VAGINAL | 0 refills | Status: AC

## 2024-04-07 NOTE — Progress Notes (Signed)
 U preg neg.  Still pain and now vag itching(has been on abx)

## 2024-04-08 ENCOUNTER — Other Ambulatory Visit (INDEPENDENT_AMBULATORY_CARE_PROVIDER_SITE_OTHER)

## 2024-04-08 ENCOUNTER — Ambulatory Visit: Payer: Self-pay | Admitting: Family Medicine

## 2024-04-08 ENCOUNTER — Other Ambulatory Visit: Payer: Self-pay | Admitting: Family Medicine

## 2024-04-08 DIAGNOSIS — R1030 Lower abdominal pain, unspecified: Secondary | ICD-10-CM

## 2024-04-08 NOTE — Progress Notes (Signed)
 D/w pt.  Still w/vag d/c and some pain.  H/o h pylori in past.  Due menses soon.  Will check h pylori  and labs.  Consider Ct, or abd u/s.    Ua and upreg neg

## 2024-04-09 ENCOUNTER — Other Ambulatory Visit (HOSPITAL_BASED_OUTPATIENT_CLINIC_OR_DEPARTMENT_OTHER): Payer: Self-pay

## 2024-04-09 LAB — CBC WITH DIFFERENTIAL/PLATELET
Basophils Absolute: 0 K/uL (ref 0.0–0.1)
Basophils Relative: 0.5 % (ref 0.0–3.0)
Eosinophils Absolute: 0.1 K/uL (ref 0.0–0.7)
Eosinophils Relative: 1.8 % (ref 0.0–5.0)
HCT: 37.4 % (ref 36.0–46.0)
Hemoglobin: 12.6 g/dL (ref 12.0–15.0)
Lymphocytes Relative: 23.8 % (ref 12.0–46.0)
Lymphs Abs: 1.7 K/uL (ref 0.7–4.0)
MCHC: 33.6 g/dL (ref 30.0–36.0)
MCV: 79.2 fl (ref 78.0–100.0)
Monocytes Absolute: 0.4 K/uL (ref 0.1–1.0)
Monocytes Relative: 6.2 % (ref 3.0–12.0)
Neutro Abs: 4.9 K/uL (ref 1.4–7.7)
Neutrophils Relative %: 67.7 % (ref 43.0–77.0)
Platelets: 308 K/uL (ref 150.0–400.0)
RBC: 4.73 Mil/uL (ref 3.87–5.11)
RDW: 13.9 % (ref 11.5–15.5)
WBC: 7.2 K/uL (ref 4.0–10.5)

## 2024-04-09 LAB — COMPREHENSIVE METABOLIC PANEL WITH GFR
ALT: 25 U/L (ref 3–35)
AST: 22 U/L (ref 5–37)
Albumin: 4.3 g/dL (ref 3.5–5.2)
Alkaline Phosphatase: 77 U/L (ref 39–117)
BUN: 13 mg/dL (ref 6–23)
CO2: 26 meq/L (ref 19–32)
Calcium: 9.2 mg/dL (ref 8.4–10.5)
Chloride: 105 meq/L (ref 96–112)
Creatinine, Ser: 0.82 mg/dL (ref 0.40–1.20)
GFR: 86.08 mL/min (ref >60.00)
Glucose, Bld: 90 mg/dL (ref 70–99)
Potassium: 4.9 meq/L (ref 3.5–5.1)
Sodium: 137 meq/L (ref 135–145)
Total Bilirubin: 0.3 mg/dL (ref 0.2–1.2)
Total Protein: 7.2 g/dL (ref 6.0–8.3)

## 2024-04-10 ENCOUNTER — Ambulatory Visit: Payer: Self-pay | Admitting: Family Medicine

## 2024-04-10 LAB — H. PYLORI BREATH TEST: H. pylori Breath Test: NOT DETECTED

## 2024-04-10 NOTE — Progress Notes (Signed)
 D/w pt.  Feeling better on voquenza re heartburn and jaw pain.  Still w/lower abd pain.  Will monitor and consider CT abd

## 2024-04-18 ENCOUNTER — Other Ambulatory Visit: Payer: Self-pay | Admitting: Family Medicine

## 2024-04-18 DIAGNOSIS — R1030 Lower abdominal pain, unspecified: Secondary | ICD-10-CM

## 2024-04-18 NOTE — Progress Notes (Signed)
 Pt finished menses.  Still w/lower abd pain and occ L flank.  U/s neg.  Labs and ua and u preg neg  will check CT.

## 2024-04-21 ENCOUNTER — Ambulatory Visit (HOSPITAL_BASED_OUTPATIENT_CLINIC_OR_DEPARTMENT_OTHER)
Admission: RE | Admit: 2024-04-21 | Discharge: 2024-04-21 | Disposition: A | Discharge status: Home / Self Care | Source: Ambulatory Visit | Attending: Family Medicine | Admitting: Family Medicine

## 2024-04-21 ENCOUNTER — Encounter (HOSPITAL_BASED_OUTPATIENT_CLINIC_OR_DEPARTMENT_OTHER): Payer: Self-pay

## 2024-04-21 DIAGNOSIS — R1030 Lower abdominal pain, unspecified: Secondary | ICD-10-CM | POA: Insufficient documentation

## 2024-04-21 MED ORDER — IOHEXOL 300 MG/ML  SOLN
100.0000 mL | Freq: Once | INTRAMUSCULAR | Status: AC | PRN
  Administered 2024-04-21: 100 mL via INTRAVENOUS

## 2024-04-22 ENCOUNTER — Ambulatory Visit: Payer: Self-pay | Admitting: Family Medicine

## 2024-04-22 NOTE — Progress Notes (Signed)
 D/w rad and pt.  Physiological cyst.  Still w/intermitt pain lower pelvis.  Taking tylenol prn.  Can refer to GI or gyn but may take 2-3 months to be seen.  Will hold off and monitor for now

## 2024-04-23 ENCOUNTER — Other Ambulatory Visit (HOSPITAL_BASED_OUTPATIENT_CLINIC_OR_DEPARTMENT_OTHER): Payer: Self-pay

## 2024-05-01 ENCOUNTER — Other Ambulatory Visit (HOSPITAL_BASED_OUTPATIENT_CLINIC_OR_DEPARTMENT_OTHER): Payer: Self-pay
